# Patient Record
Sex: Female | Born: 1935 | Race: White | Hispanic: No | Smoking: Never smoker
Health system: Southern US, Community
[De-identification: ages and names within clinical notes are randomized; demographics above are authoritative.]

## PROBLEM LIST (undated history)

## (undated) DIAGNOSIS — I639 Cerebral infarction, unspecified: Secondary | ICD-10-CM

## (undated) DIAGNOSIS — K8020 Calculus of gallbladder without cholecystitis without obstruction: Secondary | ICD-10-CM

## (undated) DIAGNOSIS — R569 Unspecified convulsions: Secondary | ICD-10-CM

## (undated) DIAGNOSIS — K635 Polyp of colon: Secondary | ICD-10-CM

## (undated) DIAGNOSIS — M1990 Unspecified osteoarthritis, unspecified site: Secondary | ICD-10-CM

## (undated) DIAGNOSIS — K5790 Diverticulosis of intestine, part unspecified, without perforation or abscess without bleeding: Secondary | ICD-10-CM

## (undated) DIAGNOSIS — E785 Hyperlipidemia, unspecified: Secondary | ICD-10-CM

## (undated) DIAGNOSIS — I10 Essential (primary) hypertension: Secondary | ICD-10-CM

## (undated) HISTORY — DX: Unspecified convulsions: R56.9

## (undated) HISTORY — DX: Calculus of gallbladder without cholecystitis without obstruction: K80.20

## (undated) HISTORY — DX: Diverticulosis of intestine, part unspecified, without perforation or abscess without bleeding: K57.90

## (undated) HISTORY — DX: Essential (primary) hypertension: I10

## (undated) HISTORY — DX: Cerebral infarction, unspecified: I63.9

## (undated) HISTORY — PX: TONSILLECTOMY: SUR1361

## (undated) HISTORY — DX: Polyp of colon: K63.5

## (undated) HISTORY — DX: Unspecified osteoarthritis, unspecified site: M19.90

## (undated) HISTORY — PX: CHOLECYSTECTOMY: SHX55

## (undated) HISTORY — DX: Hyperlipidemia, unspecified: E78.5

---

## 1973-02-03 HISTORY — PX: BREAST LUMPECTOMY: SHX2

## 1998-02-03 DIAGNOSIS — I639 Cerebral infarction, unspecified: Secondary | ICD-10-CM

## 1998-02-03 HISTORY — DX: Cerebral infarction, unspecified: I63.9

## 1998-05-18 ENCOUNTER — Other Ambulatory Visit: Admission: RE | Admit: 1998-05-18 | Discharge: 1998-05-18 | Payer: Self-pay | Admitting: Obstetrics and Gynecology

## 1998-08-02 ENCOUNTER — Emergency Department (HOSPITAL_COMMUNITY): Admission: EM | Admit: 1998-08-02 | Discharge: 1998-08-03 | Payer: Self-pay | Admitting: Emergency Medicine

## 1998-08-02 ENCOUNTER — Emergency Department (HOSPITAL_COMMUNITY): Admission: EM | Admit: 1998-08-02 | Discharge: 1998-08-02 | Payer: Self-pay | Admitting: Emergency Medicine

## 1998-08-02 ENCOUNTER — Encounter: Payer: Self-pay | Admitting: Emergency Medicine

## 1998-08-21 ENCOUNTER — Ambulatory Visit (HOSPITAL_COMMUNITY): Admission: RE | Admit: 1998-08-21 | Discharge: 1998-08-21 | Payer: Self-pay | Admitting: Cardiovascular Disease

## 1998-09-05 ENCOUNTER — Encounter: Admission: RE | Admit: 1998-09-05 | Discharge: 1998-10-18 | Payer: Self-pay

## 1999-02-04 DIAGNOSIS — K635 Polyp of colon: Secondary | ICD-10-CM

## 1999-02-04 HISTORY — DX: Polyp of colon: K63.5

## 1999-12-16 ENCOUNTER — Other Ambulatory Visit: Admission: RE | Admit: 1999-12-16 | Discharge: 1999-12-16 | Payer: Self-pay | Admitting: Gastroenterology

## 1999-12-16 ENCOUNTER — Encounter (INDEPENDENT_AMBULATORY_CARE_PROVIDER_SITE_OTHER): Payer: Self-pay

## 2001-09-13 ENCOUNTER — Encounter: Admission: RE | Admit: 2001-09-13 | Discharge: 2001-09-13 | Payer: Self-pay | Admitting: Family Medicine

## 2001-09-13 ENCOUNTER — Encounter: Payer: Self-pay | Admitting: Family Medicine

## 2003-02-19 ENCOUNTER — Other Ambulatory Visit: Payer: Self-pay

## 2004-02-04 DIAGNOSIS — K5790 Diverticulosis of intestine, part unspecified, without perforation or abscess without bleeding: Secondary | ICD-10-CM

## 2004-02-04 HISTORY — DX: Diverticulosis of intestine, part unspecified, without perforation or abscess without bleeding: K57.90

## 2004-02-27 ENCOUNTER — Ambulatory Visit: Payer: Self-pay | Admitting: Family Medicine

## 2004-03-14 ENCOUNTER — Ambulatory Visit: Payer: Self-pay | Admitting: Family Medicine

## 2004-03-15 ENCOUNTER — Encounter: Admission: RE | Admit: 2004-03-15 | Discharge: 2004-03-15 | Payer: Self-pay | Admitting: Family Medicine

## 2004-03-21 ENCOUNTER — Ambulatory Visit: Payer: Self-pay | Admitting: Gastroenterology

## 2004-03-26 ENCOUNTER — Encounter: Admission: RE | Admit: 2004-03-26 | Discharge: 2004-03-26 | Payer: Self-pay | Admitting: Family Medicine

## 2004-04-02 ENCOUNTER — Ambulatory Visit: Payer: Self-pay | Admitting: Gastroenterology

## 2004-04-10 ENCOUNTER — Encounter: Admission: RE | Admit: 2004-04-10 | Discharge: 2004-04-10 | Payer: Self-pay | Admitting: Family Medicine

## 2004-04-16 ENCOUNTER — Ambulatory Visit: Payer: Self-pay | Admitting: Family Medicine

## 2004-07-18 ENCOUNTER — Ambulatory Visit: Payer: Self-pay | Admitting: Gastroenterology

## 2004-08-02 ENCOUNTER — Ambulatory Visit: Payer: Self-pay | Admitting: Gastroenterology

## 2004-10-22 ENCOUNTER — Encounter: Admission: RE | Admit: 2004-10-22 | Discharge: 2004-10-22 | Payer: Self-pay | Admitting: Family Medicine

## 2005-06-03 ENCOUNTER — Ambulatory Visit: Payer: Self-pay | Admitting: Family Medicine

## 2005-06-26 ENCOUNTER — Encounter: Admission: RE | Admit: 2005-06-26 | Discharge: 2005-06-26 | Payer: Self-pay | Admitting: Family Medicine

## 2005-10-15 ENCOUNTER — Ambulatory Visit: Payer: Self-pay | Admitting: Family Medicine

## 2006-02-18 ENCOUNTER — Encounter: Payer: Self-pay | Admitting: Internal Medicine

## 2006-02-18 ENCOUNTER — Ambulatory Visit: Payer: Self-pay | Admitting: Family Medicine

## 2006-02-18 LAB — CONVERTED CEMR LAB
Albumin: 3.9 g/dL (ref 3.5–5.2)
BUN: 15 mg/dL (ref 6–23)
CO2: 31 meq/L (ref 19–32)
Calcium: 9.3 mg/dL (ref 8.4–10.5)
Creatinine, Ser: 0.9 mg/dL (ref 0.4–1.2)
GFR calc non Af Amer: 66 mL/min

## 2006-04-22 ENCOUNTER — Ambulatory Visit: Payer: Self-pay | Admitting: Family Medicine

## 2006-04-22 LAB — CONVERTED CEMR LAB
AST: 19 units/L (ref 0–37)
Albumin: 3.9 g/dL (ref 3.5–5.2)
Alkaline Phosphatase: 84 units/L (ref 39–117)
BUN: 21 mg/dL (ref 6–23)
Calcium: 9.3 mg/dL (ref 8.4–10.5)
Chloride: 99 meq/L (ref 96–112)
Creatinine, Ser: 0.7 mg/dL (ref 0.4–1.2)
Total Bilirubin: 0.6 mg/dL (ref 0.3–1.2)

## 2006-04-23 ENCOUNTER — Ambulatory Visit: Payer: Self-pay | Admitting: Gastroenterology

## 2006-05-07 ENCOUNTER — Encounter: Payer: Self-pay | Admitting: Family Medicine

## 2006-05-07 ENCOUNTER — Encounter: Admission: RE | Admit: 2006-05-07 | Discharge: 2006-05-07 | Payer: Self-pay | Admitting: Surgery

## 2006-05-07 ENCOUNTER — Ambulatory Visit: Payer: Self-pay | Admitting: Family Medicine

## 2006-05-07 DIAGNOSIS — J40 Bronchitis, not specified as acute or chronic: Secondary | ICD-10-CM | POA: Insufficient documentation

## 2006-05-07 DIAGNOSIS — Z8679 Personal history of other diseases of the circulatory system: Secondary | ICD-10-CM | POA: Insufficient documentation

## 2006-05-07 DIAGNOSIS — R569 Unspecified convulsions: Secondary | ICD-10-CM | POA: Insufficient documentation

## 2006-05-07 DIAGNOSIS — E785 Hyperlipidemia, unspecified: Secondary | ICD-10-CM

## 2006-05-07 DIAGNOSIS — M810 Age-related osteoporosis without current pathological fracture: Secondary | ICD-10-CM | POA: Insufficient documentation

## 2006-06-04 ENCOUNTER — Encounter (INDEPENDENT_AMBULATORY_CARE_PROVIDER_SITE_OTHER): Payer: Self-pay | Admitting: Specialist

## 2006-06-04 ENCOUNTER — Ambulatory Visit (HOSPITAL_COMMUNITY): Admission: RE | Admit: 2006-06-04 | Discharge: 2006-06-04 | Payer: Self-pay | Admitting: Surgery

## 2006-07-01 DIAGNOSIS — I10 Essential (primary) hypertension: Secondary | ICD-10-CM

## 2006-07-09 ENCOUNTER — Other Ambulatory Visit: Admission: RE | Admit: 2006-07-09 | Discharge: 2006-07-09 | Payer: Self-pay | Admitting: Family Medicine

## 2006-07-09 ENCOUNTER — Encounter: Payer: Self-pay | Admitting: Family Medicine

## 2006-07-09 ENCOUNTER — Ambulatory Visit: Payer: Self-pay | Admitting: Family Medicine

## 2006-07-09 DIAGNOSIS — L821 Other seborrheic keratosis: Secondary | ICD-10-CM | POA: Insufficient documentation

## 2006-07-09 LAB — CONVERTED CEMR LAB
Blood in Urine, dipstick: NEGATIVE
Nitrite: NEGATIVE
Protein, U semiquant: NEGATIVE
WBC Urine, dipstick: NEGATIVE

## 2006-07-13 LAB — CONVERTED CEMR LAB
Alkaline Phosphatase: 97 units/L (ref 39–117)
BUN: 20 mg/dL (ref 6–23)
Basophils Relative: 0.7 % (ref 0.0–1.0)
Bilirubin, Direct: 0.1 mg/dL (ref 0.0–0.3)
CO2: 30 meq/L (ref 19–32)
GFR calc Af Amer: 107 mL/min
Glucose, Bld: 116 mg/dL — ABNORMAL HIGH (ref 70–99)
Hemoglobin: 11.8 g/dL — ABNORMAL LOW (ref 12.0–15.0)
Lymphocytes Relative: 26.7 % (ref 12.0–46.0)
Monocytes Absolute: 0.4 10*3/uL (ref 0.2–0.7)
Monocytes Relative: 6.7 % (ref 3.0–11.0)
Neutro Abs: 3.6 10*3/uL (ref 1.4–7.7)
Neutrophils Relative %: 65 % (ref 43.0–77.0)
Potassium: 4.8 meq/L (ref 3.5–5.1)
Sodium: 139 meq/L (ref 135–145)
TSH: 1.5 microintl units/mL (ref 0.35–5.50)
Total Protein: 7.1 g/dL (ref 6.0–8.3)

## 2006-07-14 ENCOUNTER — Encounter: Payer: Self-pay | Admitting: Family Medicine

## 2006-07-15 ENCOUNTER — Encounter (INDEPENDENT_AMBULATORY_CARE_PROVIDER_SITE_OTHER): Payer: Self-pay | Admitting: *Deleted

## 2006-07-30 ENCOUNTER — Encounter: Payer: Self-pay | Admitting: Family Medicine

## 2006-07-30 ENCOUNTER — Encounter: Admission: RE | Admit: 2006-07-30 | Discharge: 2006-07-30 | Payer: Self-pay | Admitting: Family Medicine

## 2006-07-31 ENCOUNTER — Encounter (INDEPENDENT_AMBULATORY_CARE_PROVIDER_SITE_OTHER): Payer: Self-pay | Admitting: *Deleted

## 2006-08-05 ENCOUNTER — Telehealth (INDEPENDENT_AMBULATORY_CARE_PROVIDER_SITE_OTHER): Payer: Self-pay | Admitting: *Deleted

## 2006-08-18 ENCOUNTER — Ambulatory Visit: Payer: Self-pay | Admitting: Family Medicine

## 2006-08-27 ENCOUNTER — Telehealth (INDEPENDENT_AMBULATORY_CARE_PROVIDER_SITE_OTHER): Payer: Self-pay | Admitting: *Deleted

## 2007-01-07 ENCOUNTER — Ambulatory Visit: Payer: Self-pay | Admitting: Family Medicine

## 2007-02-10 ENCOUNTER — Telehealth (INDEPENDENT_AMBULATORY_CARE_PROVIDER_SITE_OTHER): Payer: Self-pay | Admitting: *Deleted

## 2007-03-24 ENCOUNTER — Telehealth (INDEPENDENT_AMBULATORY_CARE_PROVIDER_SITE_OTHER): Payer: Self-pay | Admitting: *Deleted

## 2007-04-23 ENCOUNTER — Telehealth (INDEPENDENT_AMBULATORY_CARE_PROVIDER_SITE_OTHER): Payer: Self-pay | Admitting: *Deleted

## 2007-04-28 ENCOUNTER — Ambulatory Visit: Payer: Self-pay | Admitting: Family Medicine

## 2007-04-30 LAB — CONVERTED CEMR LAB
ALT: 26 units/L (ref 0–35)
AST: 23 units/L (ref 0–37)
Albumin: 4.3 g/dL (ref 3.5–5.2)
Alkaline Phosphatase: 78 units/L (ref 39–117)
Bilirubin, Direct: 0.1 mg/dL (ref 0.0–0.3)
Cholesterol: 186 mg/dL (ref 0–200)
Total Protein: 7.4 g/dL (ref 6.0–8.3)
Triglycerides: 113 mg/dL (ref 0–149)

## 2007-05-21 ENCOUNTER — Encounter (INDEPENDENT_AMBULATORY_CARE_PROVIDER_SITE_OTHER): Payer: Self-pay | Admitting: *Deleted

## 2007-05-21 ENCOUNTER — Ambulatory Visit: Payer: Self-pay | Admitting: Family Medicine

## 2007-05-21 ENCOUNTER — Telehealth (INDEPENDENT_AMBULATORY_CARE_PROVIDER_SITE_OTHER): Payer: Self-pay | Admitting: *Deleted

## 2007-05-21 DIAGNOSIS — K3189 Other diseases of stomach and duodenum: Secondary | ICD-10-CM

## 2007-05-21 DIAGNOSIS — R1013 Epigastric pain: Secondary | ICD-10-CM

## 2007-05-21 DIAGNOSIS — R0789 Other chest pain: Secondary | ICD-10-CM

## 2007-05-21 LAB — CONVERTED CEMR LAB
Basophils Absolute: 0 10*3/uL (ref 0.0–0.1)
CK-MB: 1.5 ng/mL (ref 0.3–4.0)
CO2: 29 meq/L (ref 19–32)
Calcium: 9.5 mg/dL (ref 8.4–10.5)
GFR calc Af Amer: 91 mL/min
Glucose, Bld: 132 mg/dL — ABNORMAL HIGH (ref 70–99)
Lymphocytes Relative: 21.9 % (ref 12.0–46.0)
MCHC: 32.6 g/dL (ref 30.0–36.0)
Monocytes Relative: 4.7 % (ref 3.0–12.0)
Platelets: 394 10*3/uL (ref 150–400)
Potassium: 4.2 meq/L (ref 3.5–5.1)
RDW: 15.1 % — ABNORMAL HIGH (ref 11.5–14.6)
Sodium: 134 meq/L — ABNORMAL LOW (ref 135–145)
TSH: 1.86 microintl units/mL (ref 0.35–5.50)

## 2007-08-02 ENCOUNTER — Encounter: Admission: RE | Admit: 2007-08-02 | Discharge: 2007-08-02 | Payer: Self-pay | Admitting: Family Medicine

## 2007-08-04 ENCOUNTER — Encounter (INDEPENDENT_AMBULATORY_CARE_PROVIDER_SITE_OTHER): Payer: Self-pay | Admitting: *Deleted

## 2007-11-02 ENCOUNTER — Ambulatory Visit: Payer: Self-pay | Admitting: Family Medicine

## 2007-11-04 ENCOUNTER — Encounter (INDEPENDENT_AMBULATORY_CARE_PROVIDER_SITE_OTHER): Payer: Self-pay | Admitting: *Deleted

## 2007-11-04 LAB — CONVERTED CEMR LAB
ALT: 25 units/L (ref 0–35)
AST: 29 units/L (ref 0–37)
Bilirubin, Direct: 0.1 mg/dL (ref 0.0–0.3)
Cholesterol: 193 mg/dL (ref 0–200)
Total Bilirubin: 0.7 mg/dL (ref 0.3–1.2)
Total Protein: 7.3 g/dL (ref 6.0–8.3)
VLDL: 22 mg/dL (ref 0–40)

## 2008-02-11 ENCOUNTER — Telehealth: Payer: Self-pay | Admitting: Family Medicine

## 2008-06-21 ENCOUNTER — Telehealth (INDEPENDENT_AMBULATORY_CARE_PROVIDER_SITE_OTHER): Payer: Self-pay | Admitting: *Deleted

## 2008-09-22 ENCOUNTER — Telehealth (INDEPENDENT_AMBULATORY_CARE_PROVIDER_SITE_OTHER): Payer: Self-pay | Admitting: *Deleted

## 2008-10-19 ENCOUNTER — Ambulatory Visit: Payer: Self-pay | Admitting: Family Medicine

## 2008-10-31 ENCOUNTER — Encounter: Payer: Self-pay | Admitting: Family Medicine

## 2008-10-31 LAB — CONVERTED CEMR LAB
AST: 21 units/L (ref 0–37)
Alkaline Phosphatase: 61 units/L (ref 39–117)
BUN: 15 mg/dL (ref 6–23)
Bilirubin, Direct: 0 mg/dL (ref 0.0–0.3)
CO2: 31 meq/L (ref 19–32)
Calcium: 9.5 mg/dL (ref 8.4–10.5)
Chloride: 101 meq/L (ref 96–112)
Creatinine, Ser: 0.9 mg/dL (ref 0.4–1.2)
Glucose, Bld: 100 mg/dL — ABNORMAL HIGH (ref 70–99)
LDL Cholesterol: 74 mg/dL (ref 0–99)
Total Bilirubin: 0.8 mg/dL (ref 0.3–1.2)
Total CHOL/HDL Ratio: 2
Triglycerides: 103 mg/dL (ref 0.0–149.0)

## 2008-11-08 ENCOUNTER — Encounter: Payer: Self-pay | Admitting: Family Medicine

## 2008-11-09 ENCOUNTER — Encounter: Payer: Self-pay | Admitting: Family Medicine

## 2008-11-09 ENCOUNTER — Encounter: Admission: RE | Admit: 2008-11-09 | Discharge: 2008-11-09 | Payer: Self-pay | Admitting: Family Medicine

## 2008-11-13 ENCOUNTER — Telehealth (INDEPENDENT_AMBULATORY_CARE_PROVIDER_SITE_OTHER): Payer: Self-pay | Admitting: *Deleted

## 2008-11-22 ENCOUNTER — Telehealth (INDEPENDENT_AMBULATORY_CARE_PROVIDER_SITE_OTHER): Payer: Self-pay | Admitting: *Deleted

## 2008-11-23 ENCOUNTER — Ambulatory Visit: Payer: Self-pay | Admitting: Family Medicine

## 2008-11-23 DIAGNOSIS — R300 Dysuria: Secondary | ICD-10-CM

## 2008-11-23 LAB — CONVERTED CEMR LAB
Bilirubin Urine: NEGATIVE
Glucose, Urine, Semiquant: 100
Nitrite: NEGATIVE
Protein, U semiquant: NEGATIVE
Specific Gravity, Urine: 1.015
Urobilinogen, UA: 0.2
WBC Urine, dipstick: NEGATIVE

## 2008-11-27 ENCOUNTER — Encounter: Payer: Self-pay | Admitting: Family Medicine

## 2008-11-28 ENCOUNTER — Telehealth: Payer: Self-pay | Admitting: Family Medicine

## 2008-11-29 ENCOUNTER — Ambulatory Visit: Payer: Self-pay | Admitting: Family Medicine

## 2008-11-29 DIAGNOSIS — R109 Unspecified abdominal pain: Secondary | ICD-10-CM

## 2008-11-29 LAB — CONVERTED CEMR LAB
Blood in Urine, dipstick: NEGATIVE
Glucose, Urine, Semiquant: 250
Protein, U semiquant: NEGATIVE
Urobilinogen, UA: 0.2
WBC Urine, dipstick: NEGATIVE
pH: 6

## 2008-11-30 ENCOUNTER — Encounter: Admission: RE | Admit: 2008-11-30 | Discharge: 2008-11-30 | Payer: Self-pay | Admitting: Family Medicine

## 2008-11-30 ENCOUNTER — Encounter: Payer: Self-pay | Admitting: Family Medicine

## 2009-01-19 IMAGING — RF DG CHOLANGIOGRAM OPERATIVE
1 series · 4 of 4 positions shown · non-contrast
Comparison: none

CLINICAL DATA: Cholelithiasis

[Series 1: run · 4 of 49 frames shown]
[frame 8/49]
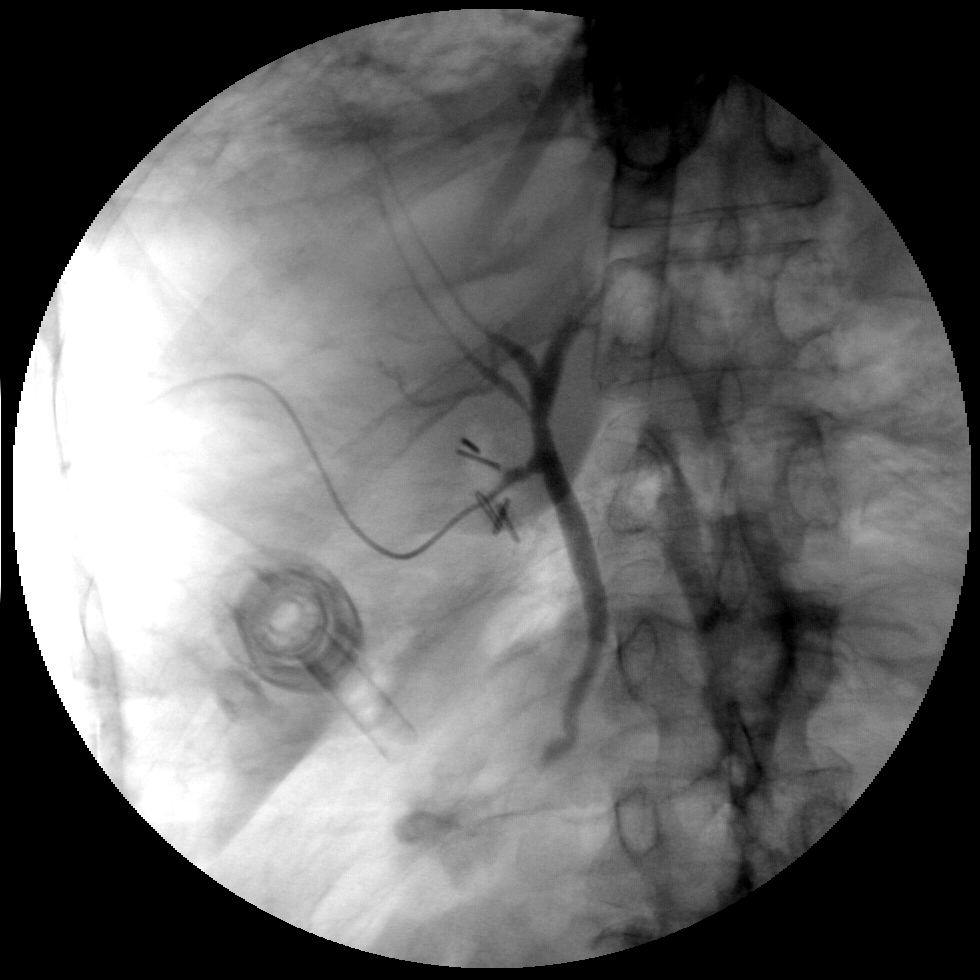
[frame 25/49]
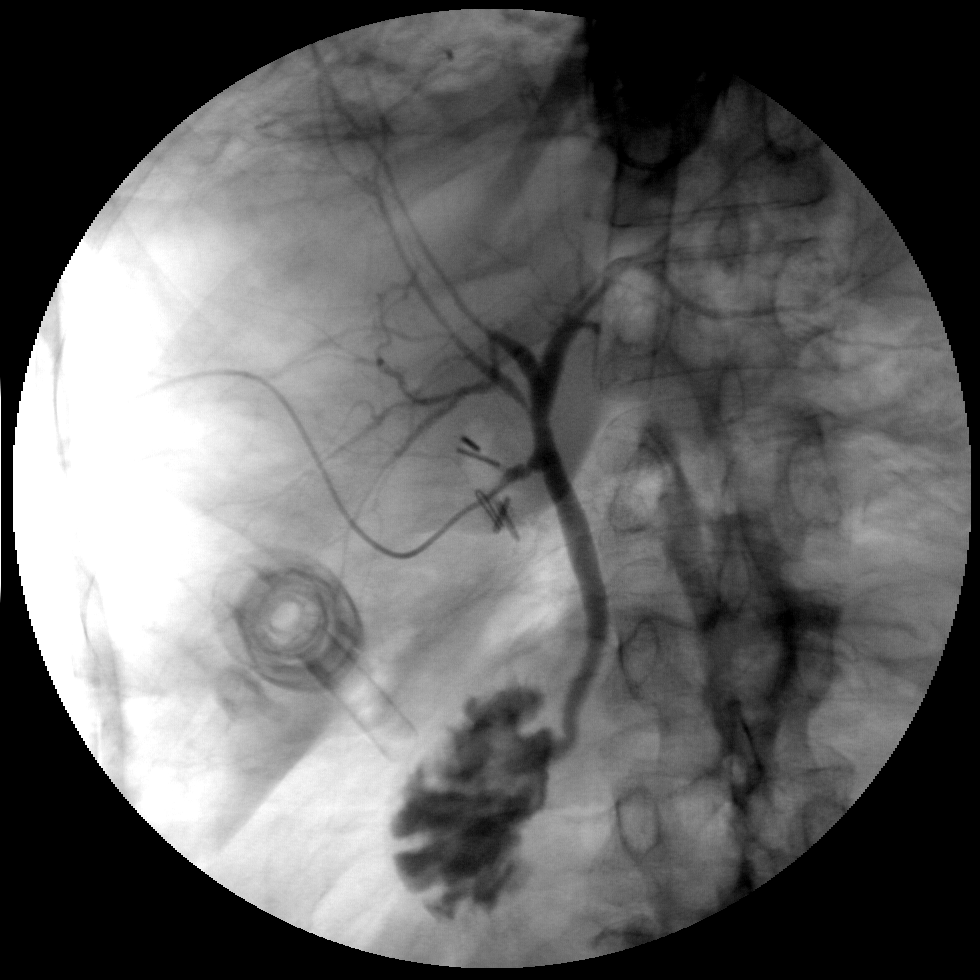
[frame 32/49]
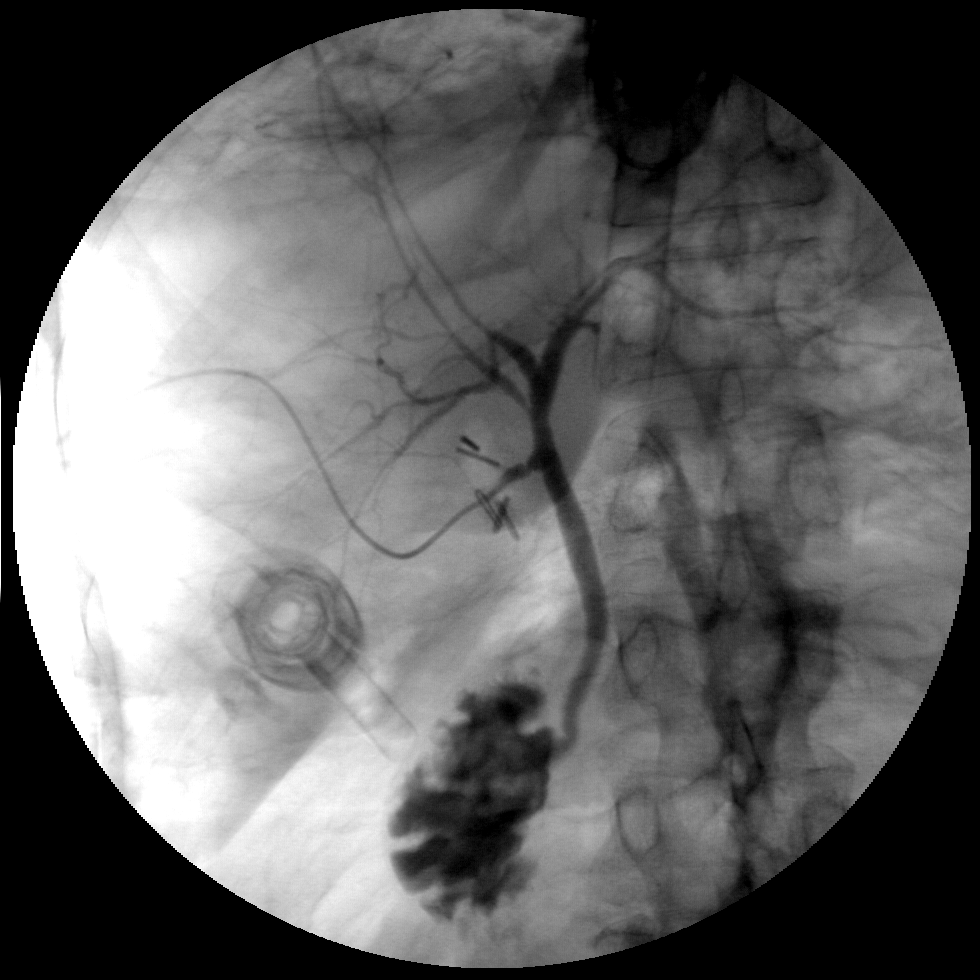
[frame 42/49]
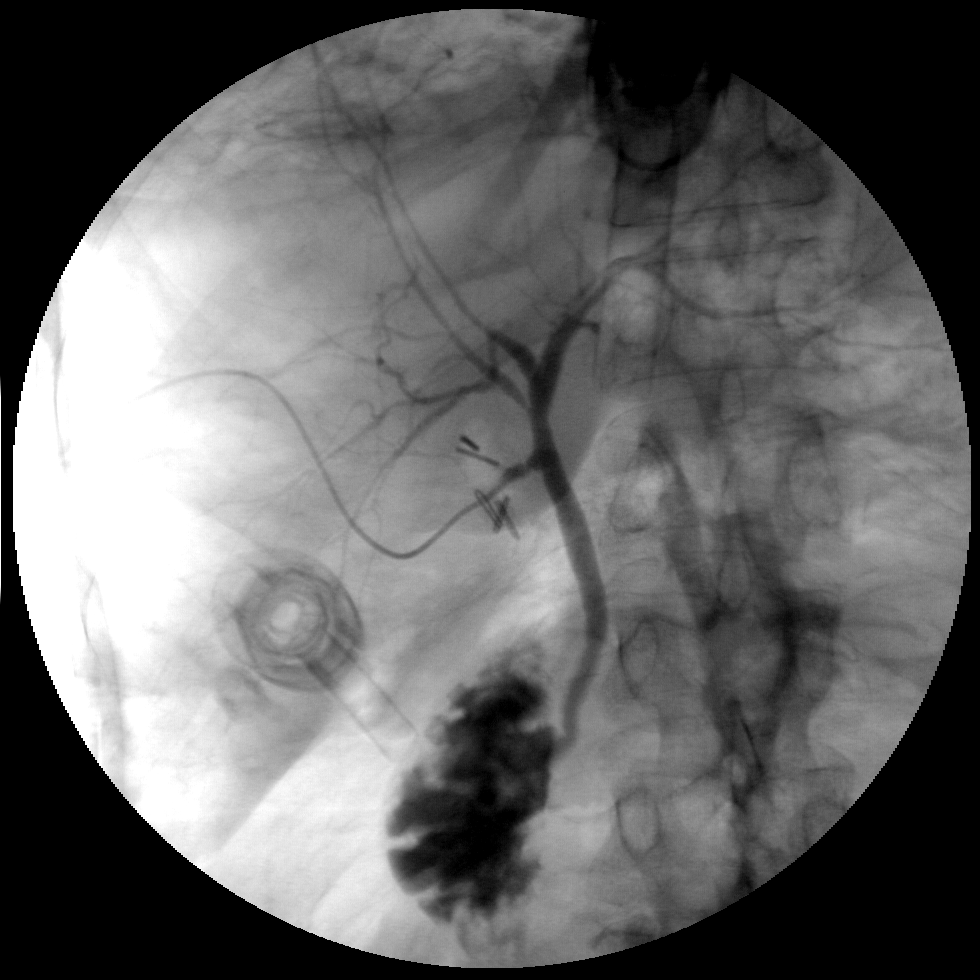

[4 of 4 positions shown; findings below may reference images not displayed]

INTRAOPERATIVE CHOLANGIOGRAM:

49  images from intraoperative C-arm fluoroscopy demonstrate  opacification of
the common bile duct. No filling defects to suggest retained stones. There is
incomplete evaluation of intrahepatic biliary tree, which appears decompressed
centrally. Contrast appears to flow on into decompressed duodenum.
IMPRESSION: 1. Negative for retained common duct stone

## 2009-09-25 ENCOUNTER — Telehealth (INDEPENDENT_AMBULATORY_CARE_PROVIDER_SITE_OTHER): Payer: Self-pay | Admitting: *Deleted

## 2009-10-01 ENCOUNTER — Encounter (INDEPENDENT_AMBULATORY_CARE_PROVIDER_SITE_OTHER): Payer: Self-pay | Admitting: *Deleted

## 2009-10-19 ENCOUNTER — Ambulatory Visit: Payer: Self-pay | Admitting: Family Medicine

## 2009-10-23 LAB — CONVERTED CEMR LAB
ALT: 20 units/L (ref 0–35)
AST: 22 units/L (ref 0–37)
BUN: 18 mg/dL (ref 6–23)
Bilirubin, Direct: 0.2 mg/dL (ref 0.0–0.3)
Calcium: 9.8 mg/dL (ref 8.4–10.5)
GFR calc non Af Amer: 74.85 mL/min (ref >60)
Glucose, Bld: 98 mg/dL (ref 70–99)
HDL: 112.4 mg/dL (ref >39.00)
Sodium: 138 meq/L (ref 135–145)
Total Protein: 6.8 g/dL (ref 6.0–8.3)
Triglycerides: 87 mg/dL (ref 0.0–149.0)
VLDL: 17.4 mg/dL (ref 0.0–40.0)
Vit D, 25-Hydroxy: 36 ng/mL (ref 30–89)

## 2009-10-26 ENCOUNTER — Telehealth (INDEPENDENT_AMBULATORY_CARE_PROVIDER_SITE_OTHER): Payer: Self-pay | Admitting: *Deleted

## 2009-11-02 ENCOUNTER — Ambulatory Visit: Payer: Self-pay | Admitting: Family Medicine

## 2009-11-06 ENCOUNTER — Encounter: Payer: Self-pay | Admitting: Family Medicine

## 2009-11-07 ENCOUNTER — Encounter: Payer: Self-pay | Admitting: Family Medicine

## 2009-12-19 ENCOUNTER — Encounter: Admission: RE | Admit: 2009-12-19 | Discharge: 2009-12-19 | Payer: Self-pay | Admitting: Family Medicine

## 2010-02-24 ENCOUNTER — Encounter: Payer: Self-pay | Admitting: Family Medicine

## 2010-03-05 NOTE — Assessment & Plan Note (Signed)
Summary: prolia injection/cdj   Nurse Visit   Allergies: No Known Drug Allergies  Medication Administration  Injection # 1:    Medication: Prolia 60mg     Diagnosis: OSTEOPOROSIS (ICD-733.00)    Route: SQ    Site: R deltoid    Exp Date: 01/04/2011    Lot #: [id number]    Mfr: amgen    Patient tolerated injection without complications    Given by: Almeta Monas CMA Duncan Dull) (November 02, 2009 2:29 PM)  Orders Added: 1)  Admin of Therapeutic Inj  intramuscular or subcutaneous [96372] 2)  Prolia 60mg  [J3590]

## 2010-03-05 NOTE — Progress Notes (Signed)
Summary: Needs to schedule Prolia Visit 9/23  Phone Note Outgoing Call   Call placed by: Almeta Monas CMA Duncan Dull),  October 26, 2009 9:34 AM Call placed to: Patient Details for Reason: Prolia  Summary of Call: Left message to call back...Marland KitchenMarland KitchenSchedule Nurse visit for Prolia Injection.  Initial call taken by: Almeta Monas CMA Duncan Dull),  October 26, 2009 9:35 AM  Follow-up for Phone Call        patient aware appt scheduled 11/01/09.Marland KitchenDoristine Devoid CMA  October 26, 2009 10:28 AM

## 2010-03-05 NOTE — Progress Notes (Signed)
Summary: refill   Phone Note Outgoing Call   Details for Reason: Pt req refill of Lipitor Summary of Call: pt over due for labs and ov----schedule ov , fasting  --then can refill x1   Treid calling pt, Lmtc on vm for return call.... Almeta Monas CMA Duncan Dull)  September 25, 2009 9:29 AM    Follow-up for Phone Call        pt aware, rx sent in to pharmacy, will call later to schedule appt...........Marland KitchenFelecia Deloach CMA  September 25, 2009 10:19 AM     Prescriptions: LIPITOR 40 MG TABS (ATORVASTATIN CALCIUM) every night at bedtime  #30 x 0   Entered by:   Jeremy Johann CMA   Authorized by:   Loreen Freud DO   Signed by:   Jeremy Johann CMA on 09/25/2009   Method used:   Faxed to ...       [facility] [address] [facility]ail[facility]       [address] [place], [place]  [address]       Ph: [phone]       Fax: [phone]   RxID:   [id number]

## 2010-03-05 NOTE — Assessment & Plan Note (Signed)
Summary: ROA  AND FASTING LABS---NEEDS LIPITOR REFILL, FLU SHOT///SPH   Vital Signs:  Patient profile:   75 year old female Height:      57.75 inches Weight:      106.2 pounds BMI:     22.47 Temp:     97.9 degrees F oral Pulse rate:   76 / minute Pulse rhythm:   regular BP sitting:   122 / 80  (left arm)  Vitals Entered By: Almeta Monas CMA Duncan Dull) (October 19, 2009 10:58 AM) CC: F/U OV with fasting labs   History of Present Illness:  Hyperlipidemia follow-up      This is a 75 year old woman who presents for Hyperlipidemia follow-up.  The patient denies muscle aches, GI upset, abdominal pain, flushing, itching, constipation, diarrhea, and fatigue.  The patient denies the following symptoms: chest pain/pressure, exercise intolerance, dypsnea, palpitations, syncope, and pedal edema.  Compliance with medications (by patient report) has been near 100%.  Dietary compliance has been good.  The patient reports no exercise.  Adjunctive measures currently used by the patient include ASA.    Hypertension follow-up      The patient also presents for Hypertension follow-up.  The patient denies lightheadedness, urinary frequency, headaches, edema, impotence, rash, and fatigue.  The patient denies the following associated symptoms: chest pain, chest pressure, exercise intolerance, dyspnea, palpitations, syncope, leg edema, and pedal edema.  Compliance with medications (by patient report) has been near 100%.  The patient reports that dietary compliance has been good.  The patient reports no exercise.  Adjunctive measures currently used by the patient include salt restriction.    Current Medications (verified): 1)  Carbatrol 300 Mg Cp12 (Carbamazepine) .... Takes One By Mouth Two Times A Day 2)  Plavix 75 Mg Tabs (Clopidogrel Bisulfate) 3)  Aspirin 81 Mg Tbec (Aspirin) 4)  Lipitor 40 Mg Tabs (Atorvastatin Calcium) .... Every Night At Bedtime 5)  Benazepril Hcl 10 Mg Tabs (Benazepril Hcl) .... Two  By Mouth Once Daily 6)  Prolia 60 Mg/ml Soln (Denosumab)  Allergies (verified): No Known Drug Allergies  Past History:  Past medical, surgical, family and social histories (including risk factors) reviewed for relevance to current acute and chronic problems.  Past Medical History: Reviewed history from 07/09/2006 and no changes required. Hyperlipidemia Seizure disorder Cerebrovascular accident, hx of,  2000 Osteoporosis Hypertension  Past Surgical History: Reviewed history from 07/09/2006 and no changes required. Cholecystectomy (06/04/2006),  Gross Lumpectomy ,  Left  1975 Tonsillectomy  Family History: Reviewed history from 07/09/2006 and no changes required. Family History Osteoporosis f- MI at 32 b- quadruple bypass  Social History: Reviewed history from 07/09/2006 and no changes required. Retired, Arts administrator-  Hospital doctor. Divorced Never Smoked Alcohol use-no Drug use-no Regular exercise-yes  Review of Systems      See HPI  Physical Exam  General:  Well-developed,well-nourished,in no acute distress; alert,appropriate and cooperative throughout examination Neck:  No deformities, masses, or tenderness noted. Lungs:  Normal respiratory effort, chest expands symmetrically. Lungs are clear to auscultation, no crackles or wheezes. Heart:  normal rate and no murmur.   Extremities:  No clubbing, cyanosis, edema, or deformity noted with normal full range of motion of all joints.   Skin:  red flakey round spot L cheek  Psych:  Oriented X3 and normally interactive.     Impression & Recommendations:  Problem # 1:  HYPERTENSION (ICD-401.9)  Her updated medication list for this problem includes:    Benazepril Hcl 10 Mg Tabs (Benazepril  hcl) ....Marland Kitchen Two by mouth once daily  Orders: Venipuncture (04540) TLB-Lipid Panel (80061-LIPID) TLB-BMP (Basic Metabolic Panel-BMET) (80048-METABOL) TLB-Hepatic/Liver Function Pnl (80076-HEPATIC)  BP today: 122/80 Prior BP: 122/80  (11/29/2008)  Labs Reviewed: K+: 4.5 (10/19/2008) Creat: : 0.9 (10/19/2008)   Chol: 190 (10/19/2008)   HDL: 95.20 (10/19/2008)   LDL: 74 (10/19/2008)   TG: 103.0 (10/19/2008)  Problem # 2:  OSTEOPOROSIS (ICD-733.00)  Her updated medication list for this problem includes:    Prolia 60 Mg/ml Soln (Denosumab)  Orders: Venipuncture (98119) TLB-Lipid Panel (80061-LIPID) TLB-BMP (Basic Metabolic Panel-BMET) (80048-METABOL) TLB-Hepatic/Liver Function Pnl (80076-HEPATIC) T-Vitamin D (25-Hydroxy) (14782-95621)  Problem # 3:  HYPERLIPIDEMIA (ICD-272.4)  Her updated medication list for this problem includes:    Lipitor 40 Mg Tabs (Atorvastatin calcium) ..... Every night at bedtime  Orders: Venipuncture (30865) TLB-Lipid Panel (80061-LIPID) TLB-BMP (Basic Metabolic Panel-BMET) (80048-METABOL) TLB-Hepatic/Liver Function Pnl (80076-HEPATIC)  Labs Reviewed: SGOT: 21 (10/19/2008)   SGPT: 20 (10/19/2008)   HDL:95.20 (10/19/2008), 94.0 (11/02/2007)  LDL:74 (10/19/2008), 77 (11/02/2007)  Chol:190 (10/19/2008), 193 (11/02/2007)  Trig:103.0 (10/19/2008), 111 (11/02/2007)  Complete Medication List: 1)  Carbatrol 300 Mg Cp12 (Carbamazepine) .... Takes one by mouth two times a day 2)  Plavix 75 Mg Tabs (Clopidogrel bisulfate) 3)  Aspirin 81 Mg Tbec (Aspirin) 4)  Lipitor 40 Mg Tabs (Atorvastatin calcium) .... Every night at bedtime 5)  Benazepril Hcl 10 Mg Tabs (Benazepril hcl) .... Two by mouth once daily 6)  Prolia 60 Mg/ml Soln (Denosumab)  Other Orders: T- * Misc. Laboratory test [phone]) Dermatology Referral [facility])  Patient Instructions: 1)  Please schedule a follow-up appointment in 6 months .                                                                                      Prescriptions: LIPITOR 40 MG TABS (ATORVASTATIN CALCIUM) every night at bedtime  #90 x 3   Entered and Authorized by:   Loreen Freud DO   Signed by:   Loreen Freud DO on 10/19/2009   Method used:    [facility]        [id number][facility] (retail[facility]       [address] [place], [place]  [address]       Ph: [phone]       Fax: [phone]   RxID:   [id number]

## 2010-03-05 NOTE — Progress Notes (Signed)
Summary: PT WOKE UP THIS MORNING WITH PAIN ON RIGHT SIDE OF CHEST  Phone Note Call from Patient Call back at Work Phone [phone]   Caller: Patient Reason for Call: Acute Illness, Talk to Nurse Summary of Call: DR LOWNE// PT GOT UP THIS MORNING AND HAD PAIN IN RIGHT SIDE OF CHEST. PT THINKS IS A MUSCLE PULLED. PT IS NOT SURE. PT WANTS TO COME IN AND GET A EKG DONE JUST TO MAKE SURE IS NOT ANYTHING ELSE. Initial call taken by: Job Founds,  May 21, 2007 8:47 AM  Follow-up for Phone Call        SPOKEW ITHPT OV SCHED THIS AM...................................................................Marland KitchenKandice Hams  May 21, 2007 10:29 AM  Follow-up by: Kandice Hams,  May 21, 2007 10:29 AM

## 2010-03-05 NOTE — Letter (Signed)
Summary: Primary Care Appointment Letter  [facility] [place]  [address] [place], [place] [address]   Phone: [phone]  Fax: [phone]    10/01/2009 MRN: [id number]  [facility] [address] [place], [place]  [address]  Dear Ms. Strohmeier,   Your Primary Care Physician Loreen Freud DO has indicated that:    ___X____it is time to schedule an appointment for fasting labs and office visit.    _______you missed your appointment on______ and need to call and          reschedule.    _______you need to have lab work done.    _______you need to schedule an appointment discuss lab or test results.    _______you need to call to reschedule your appointment that is                       scheduled on _________.     Please call our office as soon as possible. Our phone number is [phone]_____. Our office is open 8a--5p, Monday through Friday.     Thank you,    [employer] Primary Care Scheduler

## 2010-03-05 NOTE — Medication Information (Signed)
Summary: Prior Authorization for Prolia/[employer]  Prior Authorization for Prolia/[employer]   Imported By: Lanelle Bal 11/15/2009 09:38:01  _____________________________________________________________________  External Attachment:    Type:   Image     Comment:   External Document

## 2010-03-05 NOTE — Medication Information (Signed)
Summary: Approval for Prolia/[employer]  Approval for Prolia/[employer]   Imported By: Lanelle Bal 11/14/2009 09:37:13  _____________________________________________________________________  External Attachment:    Type:   Image     Comment:   External Document

## 2010-05-02 ENCOUNTER — Encounter: Payer: Self-pay | Admitting: Family Medicine

## 2010-05-03 ENCOUNTER — Ambulatory Visit: Payer: Self-pay

## 2010-05-03 ENCOUNTER — Ambulatory Visit (INDEPENDENT_AMBULATORY_CARE_PROVIDER_SITE_OTHER): Payer: Medicare Other | Admitting: Family Medicine

## 2010-05-03 ENCOUNTER — Encounter: Payer: Self-pay | Admitting: Family Medicine

## 2010-05-03 VITALS — BP 122/72 | Temp 98.9°F | Wt 113.8 lb

## 2010-05-03 DIAGNOSIS — M810 Age-related osteoporosis without current pathological fracture: Secondary | ICD-10-CM

## 2010-05-03 DIAGNOSIS — R109 Unspecified abdominal pain: Secondary | ICD-10-CM

## 2010-05-03 DIAGNOSIS — B80 Enterobiasis: Secondary | ICD-10-CM | POA: Insufficient documentation

## 2010-05-03 MED ORDER — DENOSUMAB 60 MG/ML ~~LOC~~ SOLN
60.0000 mg | Freq: Once | SUBCUTANEOUS | Status: AC
  Administered 2010-05-03: 60 mg via SUBCUTANEOUS

## 2010-05-03 MED ORDER — CARBAMAZEPINE ER 300 MG PO CP12: 300.0000 mg | ORAL_CAPSULE | Freq: Two times a day (BID) | ORAL | Status: AC

## 2010-05-03 MED ORDER — BENAZEPRIL HCL 10 MG PO TABS: 10.0000 mg | ORAL_TABLET | Freq: Every day | ORAL | Status: DC

## 2010-05-03 MED ORDER — MEBENDAZOLE 100 MG PO CHEW: CHEWABLE_TABLET | ORAL | Status: DC

## 2010-05-03 MED ORDER — CLOPIDOGREL BISULFATE 75 MG PO TABS: 75.0000 mg | ORAL_TABLET | Freq: Every day | ORAL | Status: AC

## 2010-05-03 MED ORDER — ATORVASTATIN CALCIUM 40 MG PO TABS: 40.0000 mg | ORAL_TABLET | Freq: Every day | ORAL | Status: DC

## 2010-05-03 NOTE — Progress Notes (Signed)
  Subjective:    Patient ID: [name], female    DOB: 1935-08-12, 75 y.o.   MRN: [id number]  Abdominal Pain  Pt here c/o 2-3 month hx of "bugs coming out of rectum".   No rectal itching, no diarrhea or loose stools. Pt with hx diverticulitis but has had none of those symptoms.  She says this is different.   Pt is afraid she has a worm or parasite -- she sees them in her bed.  She has some discomfort.     Review of Systems  Gastrointestinal: Positive for abdominal pain.  All other systems reviewed and are negative.       Objective:   Physical Exam  Constitutional: She appears well-developed and well-nourished.  Pulmonary/Chest: Breath sounds normal.  Abdominal: Soft. Normal appearance and bowel sounds are normal. There is no tenderness. There is no rebound.  Genitourinary:     Musculoskeletal: Normal range of motion.          Assessment & Plan:

## 2010-05-03 NOTE — Assessment & Plan Note (Signed)
?   Worms--- but doubt

## 2010-05-08 NOTE — Progress Notes (Signed)
Letter mailed    Kp 

## 2010-05-09 ENCOUNTER — Other Ambulatory Visit (INDEPENDENT_AMBULATORY_CARE_PROVIDER_SITE_OTHER): Payer: Medicare Other

## 2010-05-09 DIAGNOSIS — E785 Hyperlipidemia, unspecified: Secondary | ICD-10-CM

## 2010-05-09 LAB — HEPATIC FUNCTION PANEL
ALT: 20 U/L (ref 0–35)
AST: 21 U/L (ref 0–37)
Albumin: 4 g/dL (ref 3.5–5.2)
Total Bilirubin: 0.4 mg/dL (ref 0.3–1.2)
Total Protein: 6.5 g/dL (ref 6.0–8.3)

## 2010-05-09 LAB — LIPID PANEL
Cholesterol: 195 mg/dL (ref 0–200)
LDL Cholesterol: 67 mg/dL (ref 0–99)

## 2010-05-13 NOTE — Progress Notes (Signed)
Letter mailed     KP 

## 2010-05-31 ENCOUNTER — Encounter: Payer: Self-pay | Admitting: Family Medicine

## 2010-06-21 NOTE — Op Note (Signed)
[name], [name]               ACCOUNT NO.:  [id number]   MEDICAL RECORD NO.:  [id number]          PATIENT TYPE:  AMB   LOCATION:  DAY                          FACILITY:  [facility]   PHYSICIAN:  Ardeth Sportsman, MD     DATE OF BIRTH:  27-Jul-1935   DATE OF PROCEDURE:  06/04/2006  DATE OF DISCHARGE:                               OPERATIVE REPORT   PRIMARY CARE PHYSICIAN:  Lelon Perla, D.O.   SURGEON:  Ardeth Sportsman, M.D.   ASSISTANT:  Maisie Fus A. Cornett, M.D.   PREOPERATIVE DIAGNOSIS:  Symptomatic cholecystolithiasis.   POSTOPERATIVE DIAGNOSIS:  Symptomatic cholecystolithiasis.   PROCEDURE:  Laparoscopic cholecystectomy with intraoperative  cholangiogram.   SPECIMENS:  Gallbladder.   DRAINS:  None.   ESTIMATED BLOOD LOSS:  Less than 5 mL.   ANESTHESIA:  1. General anesthesia.  2. 0.25% bupivacaine without epinephrine and a field block around all      port sites.   INDICATIONS FOR PROCEDURE:  Ms. Orsborn is a 75 year old female with a  story of biliary colic and old gallstones.  She has had a pretty  extensive cardiopulmonary and GI workup, ruling out other possible  etiologies.   The pathophysiology of cholecystolithiasis with its risks of gallstone  pancreatitis, dehydration, severe pain and dyskinesia, cholecystitis,  and other risks were discussed.  Options were discussed and  recommendations made for a laparoscopic cholecystectomy with  intraoperative cholangiogram.  The risks such as stroke, heart attack,  deep venous thrombosis, pulmonary embolism, and death were discussed.  The risks such as bleeding, need for transfusion, wound infection,  abscess, injury to other organs, injury to bile ducts requiring  operative internal/external drainage or open reconstruction and other  risks were discussed.  Her questions were answered, and she agreed to  proceed.   FINDINGS:  She had a boggy & dilated gallbladder with a very thin cystic  duct.  She had about 2-mm  gallstone fragments x3.  The patient wanted to  keep the stones for herself, and per her request I gave the stones to  her daughter.   DESCRIPTION OF PROCEDURE:  Informed consent was confirmed.  The patient  voided just prior to going to the operating room.  She had sequential  compression devices active during the entire case.  She underwent  general anesthesia without difficulty.  She was placed supine with both  arms tucked.  Her abdomen was prepped and draped in sterile fashion.   Entry was gained into the abdomen with the patient in steep reverse  Trendelenburg with the right side up using a 5-mm/0-degree scope and  optical entry.  Capnoperitoneum to 15 mmHg provided good abdominal  insufflation.   Under direct visualization, a 5-mm port was placed through the umbilicus  in a right lateral subcostal region.  A 10-mm port was placed  subxiphoid.  There was no intraoperative injury.   The gallbladder fundus was grasped and elevated cephalad.  Peritoneal  covers between the gallbladder and the liver were freed off on the  anteromedial and posterolateral aspects.  Circumferential dissection was  done so that  the proximal third of the gallbladder was freed off the  liver bed.  The circumferential dissection revealed two structures  leaving the gallbladder going down to the porta hepatis and were  inconsistent with the cystic duct and cystic artery, given the classic  critical view.  One clip on the gallbladder side and two clips slightly  proximally were made, and the cystic artery was transected.  One clip  was made on the infundibulum, and a partial cysticotomy was performed.   A 5-French cholangiogram catheter was placed through a right subcostal  stab incision, flushed, and placed into the cystic duct without any  difficulty.  Cholangiogram was done with diluted radio-opaque contrast  revealing a side branch cannulization consistent with cystic duct  cannulization.  Contrast  flowed well into the right and left  intrahepatic chains, the common hepatic duct down to the common bile  duct and into the duodenum.  There was no evidence of any strictures,  stones, or other abnormalities.  This was consistent with a normal  cholangiogram.  The cholangiogram catheter was removed.  Four clips  remained on the cystic duct just proximal to the partial cysticotomy,  and cystic duct transection was completed.   The gallbladder was freed from its remaining attachments on the liver  and removed out the subxiphoid port, requiring no extra dilation.  The  fascial defect was greater than allowing my pinky to pass, so therefore  I did close the subfascial port fascia using a 0 Vicryl using the  laparoscopic suture passer.   Careful inspection was done, and there was excellent hemostasis on the  liver bed, with no leak of blood or bile from the liver bed or from the  clips of the stumps.  The upper three abdominal ports were removed.  There was no evidence of any bleeding on the skin or peritoneal site.  The capnoperitoneum was evacuated.  The umbilical port was removed.  The  fascial stitch was tied down in the subxiphoid region.  The skin was  closed using a 4-0 Monocryl stitch and sterile dressings.   The patient was extubated and sent to the recovery room in stable  condition.  I explained the operative findings to the patient's  daughter.  Postoperative recovery was discussed as well.  Gallstones  were given to her in a sterile container per the patient's request.      Ardeth Sportsman, MD  Electronically Signed     SCG/MEDQ  D:  06/04/2006  T:  06/04/2006  Job:  [id number]   cc:   Lelon Perla, DO  [address] [place], [place] [address]

## 2010-06-24 ENCOUNTER — Other Ambulatory Visit: Payer: Self-pay | Admitting: *Deleted

## 2010-06-24 MED ORDER — BENAZEPRIL HCL 10 MG PO TABS: 10.0000 mg | ORAL_TABLET | Freq: Every day | ORAL | Status: DC

## 2010-08-29 ENCOUNTER — Ambulatory Visit (INDEPENDENT_AMBULATORY_CARE_PROVIDER_SITE_OTHER): Payer: Medicare Other | Admitting: Internal Medicine

## 2010-08-29 ENCOUNTER — Encounter: Payer: Self-pay | Admitting: Internal Medicine

## 2010-08-29 VITALS — BP 124/82 | HR 90 | Wt 111.6 lb

## 2010-08-29 DIAGNOSIS — R350 Frequency of micturition: Secondary | ICD-10-CM

## 2010-08-29 DIAGNOSIS — N329 Bladder disorder, unspecified: Secondary | ICD-10-CM | POA: Insufficient documentation

## 2010-08-29 LAB — POCT URINALYSIS DIPSTICK
Bilirubin, UA: NEGATIVE
Glucose, UA: 100
Spec Grav, UA: 1.02
Urobilinogen, UA: 0.2

## 2010-08-29 NOTE — Progress Notes (Signed)
  Subjective:    Patient ID: [name], female    DOB: [birth year], 75 y.o.   MRN: [id number]  HPI  One-week history of urinary frequency without actual dysuria ,urine sometimes looks dark but otherwise normal.  Past Medical History  Diagnosis Date  . Hyperlipidemia   . Seizures   . Cerebrovascular accident 2000  . Osteoporosis   . Hypertension    Past Surgical History  Procedure Date  . Cholecystectomy   . Breast lumpectomy 1975    Left  . Tonsillectomy      Review of Systems No fever or chills No nausea, vomiting, diarrhea. She does have chronic constipation. Mild ill-defined discomfort at the lower abdomen, R>L    Objective:   Physical Exam  Constitutional: She is oriented to person, place, and time. She appears well-developed and well-nourished. No distress.  Abdominal:       No CVA tenderness. Abdomen is nondistended, soft, good bowel sounds, very mild discomfort actually more noticeable on the left lower abdomen. No mass or rebound.  Neurological: She is alert and oriented to person, place, and time.  Skin: She is not diaphoretic.          Assessment & Plan:

## 2010-08-29 NOTE — Assessment & Plan Note (Signed)
1 week h/o urinary frecunecy, mild lower abdominal dyscomfort ROS otherwise negative Plan: UCX Push fluids Call if sx increase, fever , abdominal pain, decreased appetite  Pt in agreement

## 2010-09-03 ENCOUNTER — Telehealth: Payer: Self-pay | Admitting: *Deleted

## 2010-09-03 MED ORDER — NITROFURANTOIN MONOHYD MACRO 100 MG PO CAPS: 100.0000 mg | ORAL_CAPSULE | Freq: Two times a day (BID) | ORAL | Status: AC

## 2010-09-03 NOTE — Telephone Encounter (Signed)
Message left for patient to return my call.  

## 2010-09-03 NOTE — Telephone Encounter (Signed)
Spoke w/ pt aware of information and medication called to pharmacy

## 2010-09-03 NOTE — Telephone Encounter (Signed)
Message copied by Leanne Lovely on Tue Sep 03, 2010  9:17 AM ------      Message from: Willow Ora E      Created: Mon Sep 02, 2010 10:12 PM       Macrodantin 100 mg 1 po bid x 1 week, drink lots of fluids , call if not better in few days

## 2010-09-09 ENCOUNTER — Ambulatory Visit: Payer: Medicare Other | Admitting: Family Medicine

## 2010-09-10 ENCOUNTER — Other Ambulatory Visit (HOSPITAL_COMMUNITY): Payer: Self-pay | Admitting: Orthopedic Surgery

## 2010-09-10 ENCOUNTER — Other Ambulatory Visit: Payer: Self-pay | Admitting: Orthopedic Surgery

## 2010-09-10 ENCOUNTER — Encounter (HOSPITAL_COMMUNITY): Payer: Medicare Other

## 2010-09-10 ENCOUNTER — Ambulatory Visit (HOSPITAL_COMMUNITY)
Admission: RE | Admit: 2010-09-10 | Discharge: 2010-09-10 | Disposition: A | Discharge status: Home / Self Care | Payer: Medicare Other | Source: Ambulatory Visit | Attending: Orthopedic Surgery | Admitting: Orthopedic Surgery

## 2010-09-10 DIAGNOSIS — Z01810 Encounter for preprocedural cardiovascular examination: Secondary | ICD-10-CM | POA: Insufficient documentation

## 2010-09-10 DIAGNOSIS — Z01818 Encounter for other preprocedural examination: Secondary | ICD-10-CM

## 2010-09-10 DIAGNOSIS — Z01812 Encounter for preprocedural laboratory examination: Secondary | ICD-10-CM | POA: Insufficient documentation

## 2010-09-10 DIAGNOSIS — M1710 Unilateral primary osteoarthritis, unspecified knee: Secondary | ICD-10-CM | POA: Insufficient documentation

## 2010-09-10 DIAGNOSIS — R9431 Abnormal electrocardiogram [ECG] [EKG]: Secondary | ICD-10-CM | POA: Insufficient documentation

## 2010-09-10 LAB — SURGICAL PCR SCREEN
MRSA, PCR: NEGATIVE
Staphylococcus aureus: NEGATIVE

## 2010-09-10 LAB — BASIC METABOLIC PANEL
BUN: 21 mg/dL (ref 6–23)
CO2: 29 mEq/L (ref 19–32)
Calcium: 10.6 mg/dL — ABNORMAL HIGH (ref 8.4–10.5)
Glucose, Bld: 106 mg/dL — ABNORMAL HIGH (ref 70–99)
Sodium: 138 mEq/L (ref 135–145)

## 2010-09-10 LAB — URINALYSIS, ROUTINE W REFLEX MICROSCOPIC
Bilirubin Urine: NEGATIVE
Glucose, UA: 250 mg/dL — AB
Hgb urine dipstick: NEGATIVE
Protein, ur: NEGATIVE mg/dL
Specific Gravity, Urine: 1.023 (ref 1.005–1.030)

## 2010-09-10 LAB — DIFFERENTIAL
Basophils Relative: 0 % (ref 0–1)
Eosinophils Absolute: 0 10*3/uL (ref 0.0–0.7)
Eosinophils Relative: 1 % (ref 0–5)
Lymphs Abs: 1.6 10*3/uL (ref 0.7–4.0)
Monocytes Absolute: 0.5 10*3/uL (ref 0.1–1.0)
Monocytes Relative: 8 % (ref 3–12)
Neutrophils Relative %: 68 % (ref 43–77)

## 2010-09-10 LAB — CBC
MCH: 32.9 pg (ref 26.0–34.0)
MCHC: 32.6 g/dL (ref 30.0–36.0)
MCV: 101 fL — ABNORMAL HIGH (ref 78.0–100.0)
Platelets: 272 10*3/uL (ref 150–400)
RBC: 3.98 MIL/uL (ref 3.87–5.11)

## 2010-09-12 ENCOUNTER — Ambulatory Visit (INDEPENDENT_AMBULATORY_CARE_PROVIDER_SITE_OTHER): Payer: Medicare Other | Admitting: Family Medicine

## 2010-09-12 ENCOUNTER — Encounter: Payer: Self-pay | Admitting: Family Medicine

## 2010-09-12 DIAGNOSIS — Z01818 Encounter for other preprocedural examination: Secondary | ICD-10-CM

## 2010-09-12 NOTE — Progress Notes (Signed)
   Subjective:    [name] is a 75 y.o. female who presents to the office today for a preoperative consultation at the request of surgeon Dr Charlann Boxer who plans on performing L total knee replacement and lateral w/wo patella resurfacing  on August 14. This consultation is requested for the specific conditions prompting preoperative evaluation (i.e. because of potential affect on operative risk): htn, hyperlipidemia and advanced age.  Planned anesthesia: spinal. The patient has the following known anesthesia issues: none. Patients bleeding risk: no recent abnormal bleeding. Patient does have objections to receiving blood products if needed.  The following portions of the patient's history were reviewed and updated as appropriate: allergies, current medications, past family history, past medical history, past social history, past surgical history and problem list.  Review of Systems Pertinent items are noted in HPI.    Objective:    BP 120/80  Pulse 78  Wt 111 lb (50.349 kg) General appearance: alert, cooperative, appears stated age and no distress Head: Normocephalic, without obvious abnormality, atraumatic Eyes: conjunctivae/corneas clear. PERRL, EOM's intact. Fundi benign. Ears: normal TM's and external ear canals both ears Nose: Nares normal. Septum midline. Mucosa normal. No drainage or sinus tenderness. Throat: lips, mucosa, and tongue normal; teeth and gums normal Neck: no adenopathy, no carotid bruit, no JVD, supple, symmetrical, trachea midline and thyroid not enlarged, symmetric, no tenderness/mass/nodules Back: negative Lungs: clear to auscultation bilaterally Heart: S1, S2 normal and + murmur Abdomen: soft, non-tender; bowel sounds normal; no masses,  no organomegaly Extremities: extremities normal, atraumatic, no cyanosis or edema Skin: Skin color, texture, turgor normal. No rashes or lesions Lymph nodes: Cervical, supraclavicular, and axillary nodes normal. Neurologic: Alert  and oriented X 3, normal strength and tone. Normal symmetric reflexes. Normal coordination and gait  Predictors of intubation difficulty:  Morbid obesity? no  Anatomically abnormal facies? no  Prominent incisors? no  Receding mandible? no  Short, thick neck? no  Neck range of motion     Cardiographics ECG: done with pre op workup Echocardiogram: not done  Imaging Chest x-ray: normal   Lab Review  not available    Assessment:      75 y.o. female with planned surgery as above.   Known risk factors for perioperative complications: Htn, hyperlipidemia   Difficulty with intubation is not anticipated.        Plan:    1. Preoperative workup as follows : already done -- results not available.. 2. Change in medication regimen before surgery: none, continue medication regimen including morning of surgery, with sip of water. 3. Prophylaxis for cardiac events with perioperative beta-blockers: should be considered, specific regimen per anesthesia. 4. Invasive hemodynamic monitoring perioperatively: at the discretion of anesthesiologist. 5. Deep vein thrombosis prophylaxis postoperatively:regimen to be chosen by surgical team. 6. Surveillance for postoperative MI with ECG immediately postoperatively and on postoperative days 1 and 2 AND troponin levels 24 hours postoperatively and on day 4 or hospital discharge (whichever comes first): at the discretion of anesthesiologist. 7. Other measures: none

## 2010-09-16 NOTE — H&P (Signed)
[name], [name]               ACCOUNT NO.:  [id number]  MEDICAL RECORD NO.:  [id number]  LOCATION:                                 FACILITY:  PHYSICIAN:  Madlyn Frankel. Charlann Boxer, M.D.  DATE OF BIRTH:  01-22-1936  DATE OF ADMISSION: DATE OF DISCHARGE:                             HISTORY & PHYSICAL   DATE OF SURGERY:  September 17, 2010.  DIAGNOSIS:  Left knee osteoarthritis.  HISTORY OF PRESENT ILLNESS:  The patient is a 75 year old white female who is complaining of left knee pain for the past 6+ years.  When she was seen for the knee pain about 6 years ago, she was told that she should consider a total knee replacement.  The patient has been living with pain, getting steroid injections, which at first helped significantly.  The duration of therapeutic effect has decreased over the years.  Her last injection lasted minimal time in past.  X-rays in the clinic do show arthritis of the left knee.  Various options were discussed with the patient.  The patient wished to proceed with surgery. Risks, benefits, and expectations of the procedure were discussed with the patient, the patient understands risks, benefits, and expectations; and wishes to proceed with left total knee replacement.  PRIMARY CARE PHYSICIAN:  Lelon Perla, DO  NEUROLOGIST:  Sandi Mealy, MD.  PAST MEDICAL HISTORY:  The patient has seizure in about 2002.  The patient has strep in 2000.  The patient has evidence for asthma, anxiety, high blood pressure, hyperlipidemia, GERD, IBS, diverticulosis, osteoporosis.  PAST SURGICAL HISTORY:  Cholecystectomy and T and A.  MEDICATIONS: 1. Plavix unknown doses (was stopped 5 days before surgery). 2. Lotensin 10 mg one p.o. daily. 3. Lipitor unknown doses. 4. Carbatrol unknown dose (this medicine is not to be substituted, her     primary care doctor). 5. Aspirin 81 mg one p.o. daily. 6. (The patient is to stay on this). 7. Vitamins. 8. Calcium.  SOCIAL HISTORY:   The patient denies use of alcohol or tobacco.  The patient is complaining of some mild constipation, heartburn, frequent urination especially at night with joint pain and swelling.  All other review of systems are benign.  PHYSICAL EXAMINATION:  GENERAL:  The patient is a 75 year old white female, in no acute distress. VITAL SIGNS:  Stable.  Blood pressure in left arm is 130/70, respiratory is 14, pulse is 80. HEENT:  Pupils are round, reactive to light and accommodation.  Throat is clear. NECK:  Supple.  No JVD.  No carotid bruits.  No lymphadenopathy. CARDIAC:  S1 and S2.  No murmur appreciated. RESPIRATORY:  Lungs clear to auscultation bilaterally. NEURO:  The patient oriented x3. ORTHO:  Pertaining to the left knee, the patient does have some tenderness to palpation associated with the medial anterior tibial surface.  The patient has good flexion.  The patient is lacking 5 degrees of full extension.  The patient has +2 dorsalis pedis pulse.  He is distally neurovascularly intact.  Surgical site looks clear.  IMPRESSION:  Left knee osteoarthritis.  PLAN:  The patient was admitted to the hospital to undergo left total knee replacement per Dr. Charlann Boxer.  Risks,  benefits, and expectations of the procedure were discussed with the patient.  The patient understands these risks, benefits, and expectations and wishes to proceed.    ______________________________ Lanney Gins, PA   ______________________________ Madlyn Frankel. Charlann Boxer, M.D.    MB/MEDQ  D:  09/11/2010  T:  09/12/2010  Job:  [id number]  Electronically Signed by Lanney Gins PA on 09/12/2010 11:54:55 AM Electronically Signed by Durene Romans M.D. on 09/16/2010 07:37:28 AM

## 2010-09-17 ENCOUNTER — Inpatient Hospital Stay (HOSPITAL_COMMUNITY)
Admission: RE | Admit: 2010-09-17 | Discharge: 2010-09-20 | DRG: 470 | Disposition: A | Discharge status: Skilled Nursing Facility | Payer: Medicare Other | Source: Ambulatory Visit | Attending: Orthopedic Surgery | Admitting: Orthopedic Surgery

## 2010-09-17 DIAGNOSIS — R350 Frequency of micturition: Secondary | ICD-10-CM | POA: Diagnosis present

## 2010-09-17 DIAGNOSIS — Z01810 Encounter for preprocedural cardiovascular examination: Secondary | ICD-10-CM

## 2010-09-17 DIAGNOSIS — K589 Irritable bowel syndrome without diarrhea: Secondary | ICD-10-CM | POA: Diagnosis present

## 2010-09-17 DIAGNOSIS — K5730 Diverticulosis of large intestine without perforation or abscess without bleeding: Secondary | ICD-10-CM | POA: Diagnosis present

## 2010-09-17 DIAGNOSIS — Z79899 Other long term (current) drug therapy: Secondary | ICD-10-CM

## 2010-09-17 DIAGNOSIS — K5900 Constipation, unspecified: Secondary | ICD-10-CM | POA: Diagnosis present

## 2010-09-17 DIAGNOSIS — R6889 Other general symptoms and signs: Secondary | ICD-10-CM | POA: Diagnosis present

## 2010-09-17 DIAGNOSIS — I959 Hypotension, unspecified: Secondary | ICD-10-CM | POA: Diagnosis not present

## 2010-09-17 DIAGNOSIS — E785 Hyperlipidemia, unspecified: Secondary | ICD-10-CM | POA: Diagnosis present

## 2010-09-17 DIAGNOSIS — Z01812 Encounter for preprocedural laboratory examination: Secondary | ICD-10-CM

## 2010-09-17 DIAGNOSIS — M1710 Unilateral primary osteoarthritis, unspecified knee: Principal | ICD-10-CM | POA: Diagnosis present

## 2010-09-17 DIAGNOSIS — Z8249 Family history of ischemic heart disease and other diseases of the circulatory system: Secondary | ICD-10-CM

## 2010-09-17 DIAGNOSIS — R9431 Abnormal electrocardiogram [ECG] [EKG]: Secondary | ICD-10-CM | POA: Diagnosis not present

## 2010-09-17 DIAGNOSIS — F411 Generalized anxiety disorder: Secondary | ICD-10-CM | POA: Diagnosis present

## 2010-09-17 DIAGNOSIS — M810 Age-related osteoporosis without current pathological fracture: Secondary | ICD-10-CM | POA: Diagnosis present

## 2010-09-17 DIAGNOSIS — K219 Gastro-esophageal reflux disease without esophagitis: Secondary | ICD-10-CM | POA: Diagnosis present

## 2010-09-17 DIAGNOSIS — I69998 Other sequelae following unspecified cerebrovascular disease: Secondary | ICD-10-CM

## 2010-09-17 DIAGNOSIS — G40909 Epilepsy, unspecified, not intractable, without status epilepticus: Secondary | ICD-10-CM | POA: Diagnosis present

## 2010-09-17 DIAGNOSIS — R55 Syncope and collapse: Secondary | ICD-10-CM | POA: Diagnosis not present

## 2010-09-17 DIAGNOSIS — Z7902 Long term (current) use of antithrombotics/antiplatelets: Secondary | ICD-10-CM

## 2010-09-17 DIAGNOSIS — D62 Acute posthemorrhagic anemia: Secondary | ICD-10-CM | POA: Diagnosis not present

## 2010-09-17 DIAGNOSIS — I10 Essential (primary) hypertension: Secondary | ICD-10-CM | POA: Diagnosis present

## 2010-09-17 DIAGNOSIS — Z7982 Long term (current) use of aspirin: Secondary | ICD-10-CM

## 2010-09-17 DIAGNOSIS — J45909 Unspecified asthma, uncomplicated: Secondary | ICD-10-CM | POA: Diagnosis present

## 2010-09-17 LAB — CARDIAC PANEL(CRET KIN+CKTOT+MB+TROPI)
CK, MB: 3.3 ng/mL (ref 0.3–4.0)
Troponin I: <0.3 ng/mL (ref <0.30)

## 2010-09-17 LAB — TYPE AND SCREEN: Antibody Screen: NEGATIVE

## 2010-09-18 DIAGNOSIS — I369 Nonrheumatic tricuspid valve disorder, unspecified: Secondary | ICD-10-CM

## 2010-09-18 LAB — CBC
Platelets: 187 10*3/uL (ref 150–400)
RBC: 2.49 MIL/uL — ABNORMAL LOW (ref 3.87–5.11)
WBC: 6.6 10*3/uL (ref 4.0–10.5)

## 2010-09-18 LAB — BASIC METABOLIC PANEL
CO2: 26 mEq/L (ref 19–32)
Chloride: 107 mEq/L (ref 96–112)
Potassium: 4.2 mEq/L (ref 3.5–5.1)
Sodium: 138 mEq/L (ref 135–145)

## 2010-09-18 LAB — CARDIAC PANEL(CRET KIN+CKTOT+MB+TROPI)
CK, MB: 2.9 ng/mL (ref 0.3–4.0)
Relative Index: 2.7 — ABNORMAL HIGH (ref 0.0–2.5)
Total CK: 108 U/L (ref 7–177)
Total CK: 124 U/L (ref 7–177)

## 2010-09-18 NOTE — Op Note (Signed)
[name], [name] NO.:  [id number]  MEDICAL RECORD NO.:  [id number]  LOCATION:  1412                         FACILITY:  [facility]  PHYSICIAN:  [name], M.D.  DATE OF BIRTH:  [birth year]  DATE OF PROCEDURE:  09/17/2010 DATE OF DISCHARGE:                              OPERATIVE REPORT   PREOPERATIVE DIAGNOSIS:  Left knee osteoarthritis.  POSTOPERATIVE DIAGNOSIS:  Left knee osteoarthritis.  PROCEDURE:  Left total knee replacement.  COMPONENTS USED:  DePuy rotating platform posterior stabilized knee system with a size 2 femur, 1.5 tibia, 12.5 insert, and a 35 patellar button.  SURGEON:  [name], M.D.  ASSISTANT:  Jaquelyn Bitter. Chabon, P.A.  ANESTHESIA:  Spinal.  SPECIMENS:  None.  COMPLICATIONS:  None.  TOURNIQUET TIME:  28 minutes, 250 mmHg.  INDICATIONS FOR PROCEDURE:  [name] is a 75 year old female with left knee osteoarthritis.  She had failed conservative measures of injections, medications, and attempts at activity modification.  She, at this point, was ready to proceed more definitive measures.  Risks of infection, DVT, component failure, need for revision surgery were discussed and reviewed, in addition to the hospital course and postop course expectations.  Consent was obtained for benefit of pain relief.  PROCEDURE IN DETAIL:  The patient was brought to operative theater. Once adequate anesthesia, preoperative antibiotics, Ancef administered, the patient was positioned supine with left thigh tourniquet placed. Left lower extremity was prepped and draped in a sterile fashion.  Time- out was performed identifying the patient, planned procedure, and extremity.  Leg was exsanguinated, tourniquet elevated to 250 mmHg.  Midline incision was made followed by median arthrotomy.  Following initial exposure and debridement, attention was first directed to the patella, precut measurement was noted to be about 21 mm to 22 mm.  I  resected down to 14 mm and used a 35 patellar button and restored height.  Lug holes were drilled and a metal shim placed to protect the patella from retractors and saw blades.  Attention now directed to femur.  Femoral canal was opened with a drill. Irrigated to try to prevent fat emboli.  An intramedullary rod was passed at 3 degrees of valgus, 10 mm of bone resected off the distal femur.  Following this resection, tibia was subluxated anteriorly.  The median lateral meniscus and cruciate stumps were debrided.  Using extramedullary guide, 10 mm of bone was resected based off the proximal lateral tibia.  At this point, we confirmed the gap would be stable with at least 10 mm insert medially and laterally and that the cut was perpendicular in the coronal points, rotation of the femoral canal with the bone was going to be based off this.  I then sized the femur at this size 2.  The size 2 block was then pinned into position anterior reference using a C clamp set rotation of the proximal tibia.  The former cutting block was then pinned into position. The anterior-posterior chamfer cuts were made followed by the box cut at the lateral aspect of the distal femur.  The tibia was then subluxated anteriorly and was seen in the best fit with size 1.5 tray in place  of 2, however, the 2 tray was pinned into position with a little bit of overhang medially and laterally.  The area was then drilled and keel punch trial reduction was carried out with 2 femur, 2 tibia, and a 12.5 insert.  With this, the knee came to full extension with stable from extension and flexion with the patella tracking through the trochlea well.  At this point, all trial components removed.  The knee was irrigated with normal saline solution and pulse lavage and the synovial capsule junction was injected with 0.25% Marcaine with epinephrine and  mL of Toradol.  Final components were opened, holding polyethylene insert,  particularly the 1.5 tibial tray.  The final components were then cemented onto clean and dried cut surfaces of bone.  The knee was brought to extension with 12.5 insert and extruded cement was removed.  Once the cement had fully cured, an excessive cement was removed throughout the knee.  The final 12.5 insert was chosen and placed in the knee.  Medium Hemovac drain was placed deep.  The extensor mechanism was then reapproximated after irrigating the knee again with #1 Vicryl with the knee in flexion.  The remainder of the wound was closed in layers with 2-0 Vicryl and running 4-0 Monocryl.  The knee was cleaned, dried, and dressed sterilely using Dermabond and Aquacel dressing.  The drain site was dressed separately.  The patient was then brought to recovery room in stable condition, tolerating the procedure well.     [name], M.D.     MDO/MEDQ  D:  09/17/2010  T:  09/18/2010  Job:  [id number]  Electronically Signed by Durene Romans M.D. on 09/18/2010 01:53:32 PM

## 2010-09-19 LAB — CBC
HCT: 26.1 % — ABNORMAL LOW (ref 36.0–46.0)
MCV: 98.9 fL (ref 78.0–100.0)
RDW: 13.6 % (ref 11.5–15.5)
WBC: 7.9 10*3/uL (ref 4.0–10.5)

## 2010-09-19 LAB — BASIC METABOLIC PANEL
BUN: 8 mg/dL (ref 6–23)
Chloride: 102 mEq/L (ref 96–112)
Creatinine, Ser: 0.55 mg/dL (ref 0.50–1.10)
GFR calc Af Amer: >60 mL/min (ref >60)

## 2010-09-23 NOTE — Discharge Summary (Signed)
NAMEMAELIE, CHRISWELL NO.:  [id number]  MEDICAL RECORD NO.:  [id number]  LOCATION:  1412                         FACILITY:  [facility]  PHYSICIAN:  Madlyn Frankel. Charlann Boxer, M.D.  DATE OF BIRTH:  10-05-1935  DATE OF ADMISSION:  09/17/2010 DATE OF DISCHARGE:  09/20/2010                        DISCHARGE SUMMARY - REFERRING   PROCEDURE:  Left total knee replacement.  ADMITTING DIAGNOSIS:  Left knee osteoarthritis.  DISCHARGE DIAGNOSES: 1. Status post left total knee arthroplasty. 2. History of seizures. 3. Asthma. 4. Anxiety. 5. Blood pressure. 6. Hyperlipidemia. 7. Gastroesophageal reflux disease. 8. Irritable bowel syndrome. 9. Diverticulosis.  HISTORY OF PRESENT ILLNESS:  The patient is a 75 year old white female who is complaining of left knee pain for the past 6+ years.  She has been seen about 6 years ago when she was told that she should consider a knee replacement.  Since that time, she has been living with the pain. She initially started getting steroid injections which did help alleviate symptoms at first, but over the years has gradually decreased in effectiveness.  Her last injection lasted only a minimal time.  X- rays in the clinic did show arthritis of the left knee.  Various options were discussed with the patient.  The patient wishes to proceed with surgery.  Risks, benefits, and expectations of the procedure were discussed with the patient.  The patient understands the risks, benefits, and expectations, and wishes to proceed with left total knee replacement.  HOSPITAL COURSE:  The patient underwent the above-stated procedure on September 17, 2010.  The patient tolerated the procedure well, was brought to the recovery room in good condition and subsequently to the floor.  On postoperative day #1, the patient did have a drop of hemoglobin to 8.3 which Cardiology diagnosed with acute blood loss anemia and the patient was not given any transfusions.  The  patient did have a near- syncopal episode and her blood pressure was low at 96/58.  Cardiology has held her ACE inhibitor at that time and blood pressure medicines were changed.  The patient was also given a fluid bolus to help increase her blood pressure; otherwise, she only complained of minimal pain, afebrile, and vital signs are stable.  She is distally neurovascularly intact and dressing looked good and Hemovac drain was removed and the patient started physical therapy.  On postoperative day #2, September 19, 2010, the patient did okay and there are no major complaints, afebrile, and vital signs are stable. Hematocrit was 26.1.  The left knee was clean, dry, and intact.  The patient again will have physical therapy.  The patient does state that her pain feels well controlled.  On postoperative day #3, September 20, 2010, the patient doing well, no events.  Labs were drawn, afebrile, and vital signs were stable.  Left knee was good, clean, dry, and intact. It was felt that the patient was doing well enough and she want to get into a more prominent environment and she would like to be discharged.  DISCHARGE CONDITION:  Good.  DISCHARGE INSTRUCTIONS:  The patient is felt to be well enough to be discharged to skilled nursing facility on September 20, 2010.  The patient  will be weightbearing as tolerated.  The patient should maintain her surgical dressing for about 8 days after which she can change it out with gauze and tape and the area should be kept dry and clean until followup.  The patient will follow up with Dr. Charlann Boxer at [facility] in 2 weeks.  The patient is to call with any concerns or questions.  DISCHARGE MEDICATIONS: 1. Benadryl 25 mg 1 p.o. q.4 h. p.r.n. 2. Colace 100 mg 1 p.o. b.i.d. p.r.n. constipation. 3. Iron sulfate 325 mg 1 p.o. t.i.d. x2 to 3 weeks. 4. Norco 7.5/325 mg 1-2 p.o. q.4-6 h. p.r.n. pain. 5. MiraLax 17 g 1 p.o. daily p.r.n. constipation. 6. Robaxin  500 mg 1 p.o. q.6 h. p.r.n. muscle spasms. 7. Metoprolol 12.5 mg 1 p.o. b.i.d. 8. Aspirin enteric-coated 81 mg 1 p.o. daily. 9. Atorvastatin 40 mg 1 p.o. q.h.s. 10.Calcium plus vitamin D 1 p.o. daily. 11.Carbamazepine 300 mg 1 p.o. b.i.d. (do not substitute). 12.Multivitamin 1 p.o. daily. 13.Vicodin. 14.Plavix 75 mg 1 p.o. q.h.s. 15.Prolia 60 mg/mL 1 injection subcutaneous twice a year.    ______________________________ Lanney Gins, PA   ______________________________ Madlyn Frankel. Charlann Boxer, M.D.    MB/MEDQ  D:  09/20/2010  T:  09/20/2010  Job:  [id number]  cc:   Lelon Perla, DO [address] [place], [place] [address]  Electronically Signed by Lanney Gins PA on 09/23/2010 09:57:56 AM Electronically Signed by Durene Romans M.D. on 09/23/2010 05:35:59 PM

## 2010-09-24 NOTE — Consult Note (Signed)
[name], LANPHEAR NO.:  [id number]  MEDICAL RECORD NO.:  [id number]  LOCATION:  1412                         FACILITY:  [facility]  PHYSICIAN:  Madolyn Frieze. Jens Som, MD, FACCDATE OF BIRTH:  September 09, 1935  DATE OF CONSULTATION:  09/17/2010 DATE OF DISCHARGE:                                CONSULTATION   PRIMARY CARE PHYSICIAN:  Lelon Perla, DO.  PRIMARY CARDIOLOGIST:  None.  CHIEF COMPLAINT:  Abnormal EKG.  HISTORY OF PRESENT ILLNESS:  [name] is a 75 year old female with a past medical history significant for hypertension, hyperlipidemia, CVA, seizure disorder, and a systolic murmur, who we are asked to evaluate for EKG changes.  She had a total left knee replacement today.  In the emergency room, she has a possible ST depression.  Cardiology was asked to evaluate her.  [name] denies chest pain or shortness of breath.  Her activity was significantly limited by her joint problems prior to admission, but she has never had exertional symptoms.  She denies orthopnea or PND.  The murmur was diagnosed by Primary Care several years ago.  There was also concern for mitral valve prolapse, but according to family this was disproved by remote echo.  She denies any shortness of breath or chest pain postoperatively and currently is resting comfortably with only a small amount of pain currently from her left knee.  PAST MEDICAL HISTORY: 1. CVA. 2. Subsequent seizures. 3. Asthma. 4. Hypertension. 5. Hyperlipidemia. 6. Family history of coronary artery disease. 7. Gastroesophageal reflux disease. 8. Irritable bowel syndrome and diverticulosis. 9. Osteoporosis. 10.History of strep in 2000.  SURGICAL HISTORY:  She is status post left total knee replacement today as well as laparoscopic cholecystectomy.  ALLERGIES:  No known drug allergies.  CURRENT MEDICATIONS: 1. Aspirin 81 mg a day. 2. Lotensin 10 mg a day. 3. Ancef. 4. Plavix 75 mg a day. 5. Colace  100 mg b.i.d. 6. Iron t.i.d. 7. Vicodin p.r.n. 8. Carbatrol ER 300 mg b.i.d. 9. Crestor 20 mg q.h.s. 10.IV fluids 100 cc an hour.  SOCIAL HISTORY:  She lives in [place] and lives alone.  Her daughter lives nearby and will help care for her after discharge.  She denies any history of alcohol, tobacco, or drug abuse.  She is retired.  FAMILY HISTORY:  Both of her parents are deceased.  Her father had coronary artery disease in his 16s or 70s and her brother had coronary artery disease diagnosed in his 43s as well.  REVIEW OF SYSTEMS:  Significant for arthralgias and joint pains.  She was ambulating with a cane prior to admission.  She had a significant limp because of the pain in her knee.  She has not had any recent illnesses, fever, or chills.  She has not had any melena.  She has not had any bleeding, which she is aware.  Full 14-point review of systems is otherwise negative except as stated in the HPI.  PHYSICAL EXAMINATION:  VITAL SIGNS:  Temperature is 98.1, blood pressure 134/83, heart rate 77, respiratory rate 14, O2 saturation 100% on 2 L. GENERAL:  She is a frail elderly white female, in no acute distress. HEENT:  Normal.  NECK:  There is no lymphadenopathy, thyromegaly, bruit, or JVD noted. CV:  Heart is regular in rate and rhythm with an S1, S2 and a soft systolic murmur is noted at the right upper sternal border.  Distal pulses are intact in all four extremities. LUNGS:  Essentially clear to auscultation bilaterally. SKIN:  No rashes or lesions are noted.  Her incision is bandaged, dressed, and not disturbed. ABDOMEN:  Soft and nontender with active bowel sounds. EXTREMITIES:  There is no cyanosis or clubbing noted. MUSCULOSKELETAL:  There is no joint deformities noted. NEUROLOGIC:  She is alert and oriented with cranial nerves II through XII grossly intact.  IMAGING STUDIES:  Chest x-ray performed on September 10, 2010, shows no acute abnormalities.  EKG is sinus  rhythm rate 71 with T-wave abnormality, consider lateral ischemia.  This is slightly different from an EKG dated September 10, 2010.  LABORATORY VALUES:  CK-MB 130/4.0 with an index of 3.1 with troponin I of less than 0.30.  IMPRESSION:  [name] was seen today by Dr. Jens Som, the patient evaluated and the data reviewed.  Ms. Abadi had possible ST depression while in OR.  She denies chest pain or shortness of breath.  Her EKG is sinus rhythm, but T-wave inversions are slightly increased compared to previous.  Initial enzymes are negative, although the index is slightly elevated.  We await follow-up enzymes.  If they remain negative then no further inpatient workup is indicated.  Because of her 2/6 systolic murmur on exam, there is concern for mild AS.  She will follow up with Dr. Jens Som and an appointment has been arranged for September.  At that time, she will get an echocardiogram.  We will continue to follow her with you.  She should remain on telemetry for 24 hours until cardiac enzymes have been cycled.     Theodore Demark, PA-C   ______________________________ Madolyn Frieze. Jens Som, MD, [facility]    RB/MEDQ  D:  09/17/2010  T:  09/18/2010  Job:  [id number]  Electronically Signed by Theodore Demark PA-C on 09/19/2010 07:59:01 PM Electronically Signed by Olga Millers MD [facility] on 09/24/2010 01:09:37 PM

## 2010-10-25 ENCOUNTER — Encounter: Payer: Medicare Other | Admitting: Cardiology

## 2010-10-25 ENCOUNTER — Other Ambulatory Visit (HOSPITAL_COMMUNITY): Payer: Medicare Other | Admitting: Radiology

## 2010-10-25 ENCOUNTER — Encounter: Payer: Self-pay | Admitting: Cardiology

## 2010-10-25 ENCOUNTER — Ambulatory Visit (INDEPENDENT_AMBULATORY_CARE_PROVIDER_SITE_OTHER): Payer: Medicare Other | Admitting: Cardiology

## 2010-10-25 DIAGNOSIS — I10 Essential (primary) hypertension: Secondary | ICD-10-CM

## 2010-10-25 DIAGNOSIS — E785 Hyperlipidemia, unspecified: Secondary | ICD-10-CM

## 2010-10-25 DIAGNOSIS — R9431 Abnormal electrocardiogram [ECG] [EKG]: Secondary | ICD-10-CM

## 2010-10-25 NOTE — Progress Notes (Signed)
HPI:75 yo female I initially saw in the hospital in August of 2012 following knee surgery for abnormal ECG and murmur. Echo in August of 2012 revealed normal LV function and grade 1 diastolic dysfunction. Pt had negative enzymes. Plan was for consideration of outpatient myoview after she recovered from knee surgery. Since discharge, she denies dyspnea on exertion, orthopnea, chest pain or syncope. Mild edema in the left lower extremity where her knee was replaced.  Current Outpatient Prescriptions  Medication Sig Dispense Refill  . aspirin 81 MG tablet Take 81 mg by mouth daily.        Marland Kitchen atorvastatin (LIPITOR) 40 MG tablet Take 1 tablet (40 mg total) by mouth at bedtime.  30 tablet  1  . Calcium Carbonate-Vitamin D (CALCIUM + D) 600-200 MG-UNIT TABS Take 1 tablet by mouth daily.        . carbamazepine (CARBATROL) 300 MG 12 hr capsule Take 1 capsule (300 mg total) by mouth 2 (two) times daily.  60 capsule  1  . clopidogrel (PLAVIX) 75 MG tablet Take 1 tablet (75 mg total) by mouth daily.  30 tablet  1  . denosumab (PROLIA) 60 MG/ML SOLN Inject 60 mg into the skin once.        Marland Kitchen HYDROcodone-acetaminophen (NORCO) 7.5-325 MG per tablet As needed      . methocarbamol (ROBAXIN) 500 MG tablet Take 1 tablet by mouth At bedtime as needed. As needed      . metoprolol tartrate (LOPRESSOR) 25 MG tablet Take 0.5 tablets by mouth Twice daily.      . Multiple Vitamin (ONE-A-DAY 55 PLUS PO) Take 1 tablet by mouth daily.           Past Medical History  Diagnosis Date  . Hyperlipidemia   . Seizures   . Cerebrovascular accident 2000  . Osteoporosis   . Hypertension     Past Surgical History  Procedure Date  . Cholecystectomy   . Breast lumpectomy 1975    Left  . Tonsillectomy     History   Social History  . Marital Status: Divorced    Spouse Name: N/A    Number of Children: N/A  . Years of Education: N/A   Occupational History  . Not on file.   Social History Main Topics  . Smoking status:  Never Smoker   . Smokeless tobacco: Never Used  . Alcohol Use: No  . Drug Use: No  . Sexually Active: Not on file   Other Topics Concern  . Not on file   Social History Narrative  . No narrative on file    ROS: no fevers or chills, productive cough, hemoptysis, dysphasia, odynophagia, melena, hematochezia, dysuria, hematuria, rash, seizure activity, orthopnea, PND, pedal edema, claudication. Remaining systems are negative.  Physical Exam: Well-developed well-nourished in no acute distress.  Skin is warm and dry.  HEENT is normal.  Neck is supple. No thyromegaly.  Chest is clear to auscultation with normal expansion.  Cardiovascular exam is regular rate and rhythm. 2/6 systolic ejection murmur. Abdominal exam nontender or distended. No masses palpated. Extremities show status post knee replacement on the left. Incision without evidence of infection. neuro grossly intact

## 2010-10-25 NOTE — Assessment & Plan Note (Signed)
Continue present medications. Blood pressure controlled today.

## 2010-10-25 NOTE — Assessment & Plan Note (Signed)
Management per primary care. 

## 2010-10-25 NOTE — Patient Instructions (Signed)
Your physician has requested that you have a lexiscan myoview. For further information please visit [email or web address]. Please follow instruction sheet, as given.   

## 2010-10-25 NOTE — Assessment & Plan Note (Signed)
Patient had electrocardiographic changes while being monitored in the operating room during her knee replacement. Her cardiac markers were negative. Plan lexiscan myoview to exclude ischemia.

## 2010-10-31 ENCOUNTER — Encounter: Payer: Self-pay | Admitting: Family Medicine

## 2010-10-31 ENCOUNTER — Ambulatory Visit (INDEPENDENT_AMBULATORY_CARE_PROVIDER_SITE_OTHER): Payer: Medicare Other | Admitting: Family Medicine

## 2010-10-31 VITALS — BP 140/78 | HR 74 | Temp 98.7°F | Wt 106.8 lb

## 2010-10-31 DIAGNOSIS — I10 Essential (primary) hypertension: Secondary | ICD-10-CM

## 2010-10-31 DIAGNOSIS — E785 Hyperlipidemia, unspecified: Secondary | ICD-10-CM

## 2010-10-31 DIAGNOSIS — Z23 Encounter for immunization: Secondary | ICD-10-CM

## 2010-10-31 DIAGNOSIS — D649 Anemia, unspecified: Secondary | ICD-10-CM

## 2010-10-31 LAB — CBC WITH DIFFERENTIAL/PLATELET
Basophils Absolute: 0 10*3/uL (ref 0.0–0.1)
Eosinophils Absolute: 0.1 10*3/uL (ref 0.0–0.7)
HCT: 41.6 % (ref 36.0–46.0)
Lymphs Abs: 1.9 10*3/uL (ref 0.7–4.0)
MCHC: 32.1 g/dL (ref 30.0–36.0)
MCV: 108.6 fl — ABNORMAL HIGH (ref 78.0–100.0)
Monocytes Absolute: 0.5 10*3/uL (ref 0.1–1.0)
Monocytes Relative: 7.4 % (ref 3.0–12.0)
Platelets: 346 10*3/uL (ref 150.0–400.0)
RDW: 15.2 % — ABNORMAL HIGH (ref 11.5–14.6)

## 2010-10-31 NOTE — Patient Instructions (Signed)
Anemia and Aging   Hemoglobin is the protein in your blood that carries oxygen. If this protein becomes too low, you are anemic. Usually mild anemia (low red blood cells) may not be noticeable. However, if it becomes more severe, you may feel tired, short of breath with activity, and may experience fainting or palpitations (heart beats you can feel). As many as 61% of elderly people have anemia. This depends on age, sex, and overall health. The chance of becoming anemic increases after age 65 and is greater in men.  CAUSES  There are many causes of anemia. Studies have shown the 2 major causes of anemia in the elderly are:    Chronic (long standing) disease.    Anemia of chronic disease: Certain chronic diseases can interfere with the production of red blood cells, resulting in chronic anemia. The kidneys produce a hormone called erythropoietin which stimulates your bone marrow to produce red blood cells. Bone marrow is the soft, spongy tissue found in bone cavities which makes and stores most of the red blood cells.    Iron is also needed for red blood cell production. In anemia of chronic disease a body can not use its stored iron. Erythropoietin is suppressed (held down from working) and the bone marrow does not respond normally. A shortage of iron and erythropoietin can result in a shortage of red blood cells.    Iron deficiency.    Iron deficiency anemia can result from chronic, often undetected blood loss. Another cause is from not eating enough iron in your diet, or when your body is not absorbing enough iron. About 20% to 36% of elderly people have idiopathic anemia. This means no cause for the anemia can be found.   EFFECTS OF UNTREATED ANEMIA  Elderly people with anemia are 40% more likely to have problems that keep them from being independent. Some of these problems include:   Poor balance.    Falling more often.    Heart trouble.    Depression.    Problems with memory and concentration.    Not  being able to walk long distances.   Anemia has been shown to shorten the life expectancy of elderly people. As early as age 65, people with kidney disease, diabetes, and/or heart failure are more likely to die if they have anemia. Anemia can also make certain medical conditions worse. Getting treatment for anemia is important. This does not guarantee you will live longer if the anemia is corrected, but it this information shows how important it is to treat your anemia.   SYMPTOMS  Most people do not realize they are anemic until a blood test is done. Symptoms and signs usually develop when anemia is moderate to severe. Symptoms can include:   Fatigue.   Weakness.    Pale skin.    Chest pain.    Dizziness.   Irritability.   Trouble breathing.    A fast heartbeat.    Headaches.    Numbness or coldness in your hands and feet.    One of the problems in identifying anemia in the elderly is that many of these symptoms are typically associated with getting older. It is important to see your caregiver on a regular basis in order to be tested for anemia.   TREATMENT  Your caregiver will provide the treatment that is best for you based on what is causing the anemia. Several medications are approved to help correct anemia. Close communication with your caregiver will help you   to receive the best anemia treatment available.   Normal Lab Values:   Normal hemoglobin is greater than 12 g/dL for women, with a hematocrit which is greater than 37%.  Normal hemoglobin is greater than 14 g/dL for men, with a normal hematocrit which is greater than 42%.  Document Released: 08/29/2003 Document Re-Released: 07/10/2009  [employer] Patient Information 2011 [employer].

## 2010-10-31 NOTE — Progress Notes (Signed)
  Subjective:    Patient ID: [name], female    DOB: Feb 10, 1935, 75 y.o.   MRN: [id number]  HPI Pt here to f/u from surgery and release from rehab.  Pt was anemic in hospital. Labs need to be done.  She has already had f/u w/ cardio.   No complaints.  Pt was released to drive but has not yet.  Her daughter is with her today.   Review of Systems As above    Objective:   Physical Exam  Constitutional: She is oriented to person, place, and time. She appears well-developed and well-nourished.  Cardiovascular: Normal rate and regular rhythm.   Musculoskeletal: She exhibits no edema and no tenderness.  Neurological: She is alert and oriented to person, place, and time.  Skin: Skin is warm.  Psychiatric: She has a normal mood and affect. Her behavior is normal. Judgment and thought content normal.          Assessment & Plan:  Htn--- stable Hyperlipidemia-- con't meds Anemia--check labs today

## 2010-11-01 LAB — IBC PANEL: Iron: 113 ug/dL (ref 42–145)

## 2010-11-06 ENCOUNTER — Encounter: Payer: Self-pay | Admitting: *Deleted

## 2010-11-12 ENCOUNTER — Other Ambulatory Visit: Payer: Self-pay | Admitting: Family Medicine

## 2010-11-15 ENCOUNTER — Encounter: Payer: Self-pay | Admitting: Internal Medicine

## 2010-11-15 ENCOUNTER — Ambulatory Visit (INDEPENDENT_AMBULATORY_CARE_PROVIDER_SITE_OTHER): Payer: Medicare Other | Admitting: Internal Medicine

## 2010-11-15 DIAGNOSIS — T148XXA Other injury of unspecified body region, initial encounter: Secondary | ICD-10-CM

## 2010-11-15 DIAGNOSIS — S0181XA Laceration without foreign body of other part of head, initial encounter: Secondary | ICD-10-CM

## 2010-11-15 DIAGNOSIS — Z0000 Encounter for general adult medical examination without abnormal findings: Secondary | ICD-10-CM

## 2010-11-15 DIAGNOSIS — S0180XA Unspecified open wound of other part of head, initial encounter: Secondary | ICD-10-CM

## 2010-11-15 DIAGNOSIS — Z23 Encounter for immunization: Secondary | ICD-10-CM

## 2010-11-15 NOTE — Progress Notes (Signed)
  Subjective:    Patient ID: [name], female    DOB: 1935/09/23, 75 y.o.   MRN: [id number]  HPI Injury in her left temple yesterday, see nurse's notes. Likes to be sure is ok. Here w/ Misty Stanley, her daughter   Past Medical History  Diagnosis Date  . Hyperlipidemia   . Seizures   . Cerebrovascular accident 2000  . Osteoporosis   . Hypertension    Past Surgical History  Procedure Date  . Cholecystectomy   . Breast lumpectomy 1975    Left  . Tonsillectomy     Review of Systems Denies any loss of consciousness No diplopia or blurred vision No actual pain in the area of the impact.    Objective:   Physical Exam  Constitutional: She appears well-developed and well-nourished. No distress.  HENT:  Head:    Eyes: Conjunctivae and EOM are normal. Pupils are equal, round, and reactive to light. Right eye exhibits no discharge. Left eye exhibits no discharge.       Orbits w/ deformities, no crepitus  Skin: She is not diaphoretic.          Assessment & Plan:  Hematoma, echymosis: Fortunately , eyes are normal, recommend to keep the area clean and dry, use over-the-counter antibiotic ointments. I anticipate a ecchymosis and hematoma will travel down to her face. Will call is visual disturbances, or if the area gets red and hot. We'll give her a tetanus shot, last was almost 10 years ago.

## 2010-11-25 ENCOUNTER — Ambulatory Visit (HOSPITAL_COMMUNITY): Payer: Medicare Other | Attending: Cardiology | Admitting: Radiology

## 2010-11-25 DIAGNOSIS — R079 Chest pain, unspecified: Secondary | ICD-10-CM

## 2010-11-25 DIAGNOSIS — I10 Essential (primary) hypertension: Secondary | ICD-10-CM

## 2010-11-25 DIAGNOSIS — R0602 Shortness of breath: Secondary | ICD-10-CM

## 2010-11-25 DIAGNOSIS — R0609 Other forms of dyspnea: Secondary | ICD-10-CM

## 2010-11-25 DIAGNOSIS — R9431 Abnormal electrocardiogram [ECG] [EKG]: Secondary | ICD-10-CM

## 2010-11-25 MED ORDER — REGADENOSON 0.4 MG/5ML IV SOLN
0.4000 mg | Freq: Once | INTRAVENOUS | Status: AC
  Administered 2010-11-25: 0.4 mg via INTRAVENOUS

## 2010-11-25 MED ORDER — TECHNETIUM TC 99M TETROFOSMIN IV KIT
11.0000 | PACK | Freq: Once | INTRAVENOUS | Status: AC | PRN
  Administered 2010-11-25: 11 via INTRAVENOUS

## 2010-11-25 MED ORDER — TECHNETIUM TC 99M TETROFOSMIN IV KIT
33.0000 | PACK | Freq: Once | INTRAVENOUS | Status: AC | PRN
  Administered 2010-11-25: 33 via INTRAVENOUS

## 2010-11-25 NOTE — Progress Notes (Signed)
[facility] SITE 3 NUCLEAR MED [address] [place] [address] [phone]  Cardiology Nuclear Med Study  [name] is a 75 y.o. female [id number] 1936-02-01   Nuclear Med Background Indication for Stress Test:  Evaluation for Ischemia and Abnormal EKG History:  Asthma, 08/12 Echo: EF 60-65% NL and MVP Cardiac Risk Factors: CVA, Family History - CAD and Lipids  Symptoms:  Chest Pain, DOE, Palpitations and SOB   Nuclear Pre-Procedure Caffeine/Decaff Intake:  None NPO After: 10:00pm   Lungs: clear IV 0.9% NS with Angio Cath:  22g  IV Site: R Forearm  IV Started by:  Stanton Kidney, EMT-P  Chest Size (in):  36 Cup Size: B  Height: 4' 9.75" (1.467 m)  Weight:  106 lb (48.081 kg)  BMI:  Body mass index is 22.35 kg/(m^2). Tech Comments:  Lopressor held this am, per patient.    Nuclear Med Study 1 or 2 day study: 1 day  Stress Test Type:  Eugenie Birks  Reading MD: Dietrich Pates, MD  Order Authorizing Provider:  B.Crenshaw  Resting Radionuclide: Technetium 77m Tetrofosmin  Resting Radionuclide Dose: 11 mCi   Stress Radionuclide:  Technetium 51m Tetrofosmin  Stress Radionuclide Dose: 33 mCi           Stress Protocol Rest HR: 71 Stress HR: 107  Rest BP: 157/74 Stress BP: 161/65  Exercise Time (min): n/a METS: n/a   Predicted Max HR: 147 bpm % Max HR: 72.79 bpm Rate Pressure Product: 46962   Dose of Adenosine (mg):  n/a Dose of Lexiscan: 0.4 mg  Dose of Atropine (mg): n/a Dose of Dobutamine: n/a mcg/kg/min (at max HR)  Stress Test Technologist: Milana Na, EMT-P  Nuclear Technologist:  Domenic Polite, CNMT     Rest Procedure:  Myocardial perfusion imaging was performed at rest 45 minutes following the intravenous administration of Technetium 76m Tetrofosmin. Rest ECG: NSR  Stress Procedure:  The patient received IV Lexiscan 0.4 mg over 15-seconds.  Technetium 4m Tetrofosmin injected at 30-seconds.  There were non specific changes, abdiminal  pain,sob,hot, and feels weird with Lexiscan.  Quantitative spect images were obtained after a 45 minute delay. Stress ECG: No significant change from baseline ECG  QPS Raw Data Images: Rest images were motion corrected. Stress Images:  Normal homogeneous uptake in all areas of the myocardium. Rest Images:  Normal homogeneous uptake in all areas of the myocardium. Subtraction (SDS):  No evidence of ischemia. Transient Ischemic Dilatation (Normal <1.22):  .81 Lung/Heart Ratio (Normal <0.45):  .20  Quantitative Gated Spect Images QGS EDV:  40 ml QGS ESV:  7 ml QGS cine images:  NL LV Function; NL Wall Motion QGS EF: 83%  Impression Exercise Capacity:  Lexiscan with no exercise. BP Response:  Normal blood pressure response. Clinical Symptoms:  No chest pain. ECG Impression:  No significant ST segment change suggestive of ischemia. Comparison with Prior Nuclear Study: No previous test to compare.  Overall Impression:  Normal stress nuclear study.  LVEF greater than 70% Dietrich Pates

## 2010-11-29 ENCOUNTER — Ambulatory Visit (INDEPENDENT_AMBULATORY_CARE_PROVIDER_SITE_OTHER): Payer: Medicare Other

## 2010-11-29 ENCOUNTER — Other Ambulatory Visit (INDEPENDENT_AMBULATORY_CARE_PROVIDER_SITE_OTHER): Payer: Medicare Other

## 2010-11-29 DIAGNOSIS — E785 Hyperlipidemia, unspecified: Secondary | ICD-10-CM

## 2010-11-29 DIAGNOSIS — M810 Age-related osteoporosis without current pathological fracture: Secondary | ICD-10-CM

## 2010-11-29 DIAGNOSIS — D649 Anemia, unspecified: Secondary | ICD-10-CM

## 2010-11-29 LAB — LIPID PANEL
Cholesterol: 182 mg/dL (ref 0–200)
HDL: 98.9 mg/dL (ref >39.00)
VLDL: 26.4 mg/dL (ref 0.0–40.0)

## 2010-11-29 LAB — CBC WITH DIFFERENTIAL/PLATELET
Basophils Absolute: 0 10*3/uL (ref 0.0–0.1)
Hemoglobin: 13.9 g/dL (ref 12.0–15.0)
Lymphocytes Relative: 25.7 % (ref 12.0–46.0)
Monocytes Relative: 6.9 % (ref 3.0–12.0)
Neutro Abs: 4.4 10*3/uL (ref 1.4–7.7)
Platelets: 316 10*3/uL (ref 150.0–400.0)
RDW: 14.5 % (ref 11.5–14.6)
WBC: 6.7 10*3/uL (ref 4.5–10.5)

## 2010-11-29 LAB — BASIC METABOLIC PANEL
Calcium: 9.2 mg/dL (ref 8.4–10.5)
GFR: 110.35 mL/min (ref >60.00)
Glucose, Bld: 114 mg/dL — ABNORMAL HIGH (ref 70–99)
Sodium: 139 mEq/L (ref 135–145)

## 2010-11-29 LAB — HEPATIC FUNCTION PANEL
AST: 22 U/L (ref 0–37)
Bilirubin, Direct: 0 mg/dL (ref 0.0–0.3)
Total Bilirubin: 0.3 mg/dL (ref 0.3–1.2)

## 2010-11-29 MED ORDER — DENOSUMAB 60 MG/ML ~~LOC~~ SOLN
60.0000 mg | Freq: Once | SUBCUTANEOUS | Status: AC
  Administered 2010-11-29: 60 mg via SUBCUTANEOUS

## 2010-11-29 NOTE — Progress Notes (Signed)
Labs only

## 2010-12-11 ENCOUNTER — Other Ambulatory Visit: Payer: Self-pay | Admitting: Family Medicine

## 2010-12-24 ENCOUNTER — Other Ambulatory Visit: Payer: Self-pay | Admitting: Family Medicine

## 2010-12-24 NOTE — Telephone Encounter (Signed)
Pt advise that urine will need to be check prior to any med being Rx. Pt aware OV schedule.

## 2010-12-25 ENCOUNTER — Ambulatory Visit (INDEPENDENT_AMBULATORY_CARE_PROVIDER_SITE_OTHER): Payer: Medicare Other | Admitting: Family Medicine

## 2010-12-25 ENCOUNTER — Encounter: Payer: Self-pay | Admitting: Family Medicine

## 2010-12-25 VITALS — BP 122/80 | HR 85 | Temp 98.1°F | Wt 112.0 lb

## 2010-12-25 DIAGNOSIS — N390 Urinary tract infection, site not specified: Secondary | ICD-10-CM

## 2010-12-25 DIAGNOSIS — R81 Glycosuria: Secondary | ICD-10-CM

## 2010-12-25 DIAGNOSIS — R7309 Other abnormal glucose: Secondary | ICD-10-CM

## 2010-12-25 LAB — POCT URINALYSIS DIPSTICK
Ketones, UA: NEGATIVE
Spec Grav, UA: 1.01
pH, UA: 7

## 2010-12-25 LAB — BASIC METABOLIC PANEL
CO2: 32 mEq/L (ref 19–32)
Chloride: 100 mEq/L (ref 96–112)
GFR: 79.16 mL/min (ref >60.00)
Glucose, Bld: 106 mg/dL — ABNORMAL HIGH (ref 70–99)
Potassium: 5.7 mEq/L — ABNORMAL HIGH (ref 3.5–5.1)
Sodium: 139 mEq/L (ref 135–145)

## 2010-12-25 MED ORDER — CIPROFLOXACIN HCL 500 MG PO TABS: 500.0000 mg | ORAL_TABLET | Freq: Two times a day (BID) | ORAL | Status: DC

## 2010-12-25 NOTE — Patient Instructions (Signed)

## 2010-12-25 NOTE — Progress Notes (Signed)
  Subjective:    [name] is a 75 y.o. female who complains of frequency and suprapubic pressure. She has had symptoms for since last visit -. Patient also complains of none. Patient denies back pain, congestion, cough, fever, headache, rhinitis, sorethroat, stomach ache and vaginal discharge. Patient does have a history of recurrent UTI. Patient does not have a history of pyelonephritis.   The following portions of the patient's history were reviewed and updated as appropriate: allergies, current medications, past family history, past medical history, past social history, past surgical history and problem list.  Review of Systems Pertinent items are noted in HPI.    Objective:    BP 122/80  Pulse 85  Temp(Src) 98.1 F (36.7 C) (Oral)  Wt 112 lb (50.803 kg)  SpO2 96% General appearance: alert, cooperative and appears stated age Abdomen: soft, non-tender; bowel sounds normal; no masses,  no organomegaly  Laboratory:  Urine dipstick: mod for glucose, trace for hemoglobin, moderate for leukocyte esterase and trace for protein.   Micro exam: not done.   cbg--99 Assessment:    dysuria     Plan:    Medications: ciprofloxacin. Maintain adequate hydration. Follow up if symptoms not improving, and as needed.

## 2010-12-25 NOTE — Progress Notes (Signed)
Addended by: Arnette Norris on: 12/25/2010 03:06 PM   Modules accepted: Orders

## 2010-12-27 ENCOUNTER — Telehealth: Payer: Self-pay | Admitting: Family Medicine

## 2010-12-27 MED ORDER — NITROFURANTOIN MONOHYD MACRO 100 MG PO CAPS: 100.0000 mg | ORAL_CAPSULE | Freq: Two times a day (BID) | ORAL | Status: DC

## 2010-12-27 NOTE — Telephone Encounter (Signed)
Last OV 12/25/10

## 2010-12-27 NOTE — Telephone Encounter (Signed)
Dx w/ a UTI, Rx cipro, UCX pending. There is a theoretical risk of interaction between cipro and carbatrol (per Uptodate);  D/c cipro, call macrobid 100 mg 1 po bid x 3 days

## 2010-12-27 NOTE — Telephone Encounter (Signed)
Spoke with patient, all information ok'd. New rx sent in

## 2010-12-29 LAB — URINE CULTURE: Colony Count: 40000

## 2010-12-30 ENCOUNTER — Telehealth: Payer: Self-pay | Admitting: Family Medicine

## 2010-12-30 NOTE — Telephone Encounter (Signed)
Left message to call office per note 12-27-10 Pt to D/c cipro, call macrobid 100 mg 1 po bid x 3 days.

## 2010-12-30 NOTE — Telephone Encounter (Signed)
new rx was called in but for 3 days - patient thought she  have 4 days of the med - needs 1 more pill --- she took 1 day of the med she discontinued - 3 days of the new med  -[facility]

## 2010-12-30 NOTE — Telephone Encounter (Signed)
Discuss with patient. Pt denies being symptomatic now.

## 2011-01-07 ENCOUNTER — Other Ambulatory Visit: Payer: Self-pay | Admitting: Family Medicine

## 2011-01-07 DIAGNOSIS — N390 Urinary tract infection, site not specified: Secondary | ICD-10-CM

## 2011-01-08 ENCOUNTER — Other Ambulatory Visit (INDEPENDENT_AMBULATORY_CARE_PROVIDER_SITE_OTHER): Payer: Medicare Other

## 2011-01-08 DIAGNOSIS — N390 Urinary tract infection, site not specified: Secondary | ICD-10-CM

## 2011-01-08 LAB — POCT URINALYSIS DIPSTICK
Ketones, UA: NEGATIVE
Protein, UA: NEGATIVE
pH, UA: 7

## 2011-01-08 NOTE — Progress Notes (Signed)
12  

## 2011-01-12 LAB — URINE CULTURE

## 2011-01-16 ENCOUNTER — Other Ambulatory Visit: Payer: Self-pay | Admitting: Family Medicine

## 2011-01-16 DIAGNOSIS — E876 Hypokalemia: Secondary | ICD-10-CM

## 2011-01-17 ENCOUNTER — Other Ambulatory Visit (INDEPENDENT_AMBULATORY_CARE_PROVIDER_SITE_OTHER): Payer: Medicare Other

## 2011-01-17 DIAGNOSIS — E876 Hypokalemia: Secondary | ICD-10-CM

## 2011-01-17 LAB — POTASSIUM: Potassium: 4.4 mEq/L (ref 3.5–5.1)

## 2011-02-07 ENCOUNTER — Other Ambulatory Visit: Payer: Self-pay | Admitting: Family Medicine

## 2011-02-07 NOTE — Telephone Encounter (Signed)
rx sent to pharmacy by e-script  

## 2011-05-13 ENCOUNTER — Other Ambulatory Visit: Payer: Self-pay | Admitting: Family Medicine

## 2011-06-16 ENCOUNTER — Other Ambulatory Visit: Payer: Self-pay | Admitting: Family Medicine

## 2011-06-16 MED ORDER — ATORVASTATIN CALCIUM 40 MG PO TABS: 40.0000 mg | ORAL_TABLET | Freq: Every day | ORAL | Status: DC

## 2011-06-16 NOTE — Telephone Encounter (Signed)
refill Atorvastatin 40mg  tablet Qty 30 Take one tablet by mouth at bedtime Last filled 4.15.13 Last ov 11.21.12

## 2011-06-17 ENCOUNTER — Telehealth: Payer: Self-pay

## 2011-06-17 NOTE — Telephone Encounter (Signed)
Discussed with patient and I am still waiting for prior authorization approval from the insurance company. I made her aware as soon as I heard from them I would give her a call.    KP

## 2011-06-17 NOTE — Telephone Encounter (Signed)
Message left on triage voicemail: Patient would like to know the status of her getting Prolia injection. Please advise

## 2011-06-18 ENCOUNTER — Ambulatory Visit (INDEPENDENT_AMBULATORY_CARE_PROVIDER_SITE_OTHER): Payer: Medicare Other

## 2011-06-18 DIAGNOSIS — M810 Age-related osteoporosis without current pathological fracture: Secondary | ICD-10-CM

## 2011-06-18 MED ORDER — DENOSUMAB 60 MG/ML ~~LOC~~ SOLN
60.0000 mg | Freq: Once | SUBCUTANEOUS | Status: AC
  Administered 2011-06-18: 60 mg via SUBCUTANEOUS

## 2011-07-24 ENCOUNTER — Telehealth: Payer: Self-pay | Admitting: Family Medicine

## 2011-07-24 DIAGNOSIS — M810 Age-related osteoporosis without current pathological fracture: Secondary | ICD-10-CM

## 2011-07-24 NOTE — Telephone Encounter (Signed)
Order in      KP 

## 2011-07-24 NOTE — Telephone Encounter (Signed)
Pt states she needs an order for her bone density scan.

## 2011-07-28 ENCOUNTER — Other Ambulatory Visit: Payer: Self-pay | Admitting: Family Medicine

## 2011-07-30 ENCOUNTER — Other Ambulatory Visit: Payer: Self-pay | Admitting: Family Medicine

## 2011-07-30 DIAGNOSIS — Z1231 Encounter for screening mammogram for malignant neoplasm of breast: Secondary | ICD-10-CM

## 2011-08-11 ENCOUNTER — Other Ambulatory Visit: Payer: Self-pay | Admitting: Family Medicine

## 2011-08-14 ENCOUNTER — Other Ambulatory Visit: Payer: Medicare Other

## 2011-08-14 ENCOUNTER — Ambulatory Visit: Payer: Medicare Other

## 2011-08-25 ENCOUNTER — Other Ambulatory Visit: Payer: Self-pay | Admitting: Family Medicine

## 2011-08-27 ENCOUNTER — Ambulatory Visit
Admission: RE | Admit: 2011-08-27 | Discharge: 2011-08-27 | Disposition: A | Discharge status: Home / Self Care | Payer: Medicare Other | Source: Ambulatory Visit | Attending: Family Medicine | Admitting: Family Medicine

## 2011-08-27 DIAGNOSIS — Z1231 Encounter for screening mammogram for malignant neoplasm of breast: Secondary | ICD-10-CM

## 2011-08-27 DIAGNOSIS — M810 Age-related osteoporosis without current pathological fracture: Secondary | ICD-10-CM

## 2011-09-11 ENCOUNTER — Encounter: Payer: Self-pay | Admitting: Family Medicine

## 2011-09-29 ENCOUNTER — Other Ambulatory Visit: Payer: Self-pay | Admitting: Family Medicine

## 2011-10-14 ENCOUNTER — Encounter: Payer: Self-pay | Admitting: Family Medicine

## 2011-10-14 ENCOUNTER — Ambulatory Visit (INDEPENDENT_AMBULATORY_CARE_PROVIDER_SITE_OTHER): Payer: Medicare Other | Admitting: Family Medicine

## 2011-10-14 ENCOUNTER — Other Ambulatory Visit (INDEPENDENT_AMBULATORY_CARE_PROVIDER_SITE_OTHER): Payer: Medicare Other

## 2011-10-14 VITALS — BP 130/72 | HR 68 | Temp 97.9°F | Wt 122.6 lb

## 2011-10-14 DIAGNOSIS — R5383 Other fatigue: Secondary | ICD-10-CM

## 2011-10-14 DIAGNOSIS — E785 Hyperlipidemia, unspecified: Secondary | ICD-10-CM

## 2011-10-14 DIAGNOSIS — R5381 Other malaise: Secondary | ICD-10-CM

## 2011-10-14 DIAGNOSIS — K589 Irritable bowel syndrome without diarrhea: Secondary | ICD-10-CM

## 2011-10-14 DIAGNOSIS — R195 Other fecal abnormalities: Secondary | ICD-10-CM

## 2011-10-14 DIAGNOSIS — R52 Pain, unspecified: Secondary | ICD-10-CM

## 2011-10-14 LAB — BASIC METABOLIC PANEL
Chloride: 99 mEq/L (ref 96–112)
Potassium: 3.7 mEq/L (ref 3.5–5.1)

## 2011-10-14 LAB — LIPID PANEL
Cholesterol: 193 mg/dL (ref 0–200)
LDL Cholesterol: 58 mg/dL (ref 0–99)
Triglycerides: 149 mg/dL (ref 0.0–149.0)
VLDL: 29.8 mg/dL (ref 0.0–40.0)

## 2011-10-14 LAB — CBC WITH DIFFERENTIAL/PLATELET
Basophils Relative: 0.3 % (ref 0.0–3.0)
Eosinophils Relative: 0.7 % (ref 0.0–5.0)
HCT: 42.6 % (ref 36.0–46.0)
Lymphs Abs: 1.6 10*3/uL (ref 0.7–4.0)
MCV: 101.6 fl — ABNORMAL HIGH (ref 78.0–100.0)
Monocytes Absolute: 0.5 10*3/uL (ref 0.1–1.0)
Neutrophils Relative %: 70.9 % (ref 43.0–77.0)
RBC: 4.19 Mil/uL (ref 3.87–5.11)
WBC: 7.4 10*3/uL (ref 4.5–10.5)

## 2011-10-14 LAB — HEPATIC FUNCTION PANEL
Albumin: 4.6 g/dL (ref 3.5–5.2)
Total Protein: 7.5 g/dL (ref 6.0–8.3)

## 2011-10-14 NOTE — Patient Instructions (Addendum)

## 2011-10-14 NOTE — Progress Notes (Signed)
  Subjective:    Patient ID: [name], female    DOB: 1935-06-13, 76 y.o.   MRN: [id number]  HPI Pt here c/o fatigue and is very anxious about her stools.  She is still convinced she has a parasite even though O&P neg and UC check her as well and did not find anything.   She is very anxious over this and is sad about putting her dog down from cancer.  Pt states everytime she eats she has a loose BM.  No abd pain,  No NVD.      Review of Systems As above    Objective:   Physical Exam  Constitutional: She is oriented to person, place, and time. She appears well-developed and well-nourished. No distress.  Cardiovascular: Normal rate and regular rhythm.   Pulmonary/Chest: Effort normal and breath sounds normal.  Abdominal: Soft. She exhibits no distension. There is no tenderness. There is no rebound and no guarding.  Musculoskeletal: She exhibits no edema.  Neurological: She is alert and oriented to person, place, and time.  Psychiatric: Thought content normal.       Pt is teary eyed and fatigued          Assessment & Plan:   Subjective:     [name] is a 76 y.o. female who presents for evaluation of fatigue. Symptoms began about the same time she had to put her dog down.. The patient feels the fatigue began with: death of dog. Symptoms of her fatigue have been anxiousness, feelings of depression, general malaise and intermittent intestinal cramping and loose stools. Patient describes the following psychological symptoms: stress related to recent death of loved one. Patient denies change in hair texture, cold intolerance, constipation, excessive menstrual bleeding, exercise intolerance, fever, GI blood loss, significant change in weight, symptoms of arthritis, unusual rashes and witnessed or suspected sleep apnea. Symptoms have progressed to a point and plateaued. Symptom severity: struggles to carry out day to day responsibilities.. Previous visits for this problem: none.    The following portions of the patient's history were reviewed and updated as appropriate: allergies, current medications, past family history, past medical history, past social history, past surgical history and problem list.  Review of Systems Pertinent items are noted in HPI.    Objective:    BP 130/72  Pulse 68  Temp 97.9 F (36.6 C) (Oral)  Wt 122 lb 9.6 oz (55.611 kg)  SpO2 97% General appearance: alert, cooperative, appears stated age and no distress Lungs: clear to auscultation bilaterally Heart: S1, S2 normal Abdomen: soft, non-tender; bowel sounds normal; no masses,  no organomegaly Neurologic: Grossly normal Psych-- + sad, teary, talking about dog    Assessment:    fatigue ---? IBS --pt denies depression or stress reaction but I do think this plays a part  Plan:    Referral to GI because she is so worried about her stools---I tried to reassure her but she is concerned. d/w pt depression and she denies this --check labs  And pt will f/u if symptoms persist.

## 2011-10-15 ENCOUNTER — Encounter: Payer: Self-pay | Admitting: Gastroenterology

## 2011-10-20 ENCOUNTER — Telehealth: Payer: Self-pay | Admitting: Family Medicine

## 2011-10-20 NOTE — Telephone Encounter (Signed)
Spoke with pt's daughter and she wanted to make sure pt was not given anything for IBS last week.  She was unsure after talking with her mother.  Explained that nothing was in her chart, but that she was referred to GI for her GI symptoms.  Either this office or GI will call with appt and go from there.  Daughter was satisfied with response.

## 2011-10-20 NOTE — Telephone Encounter (Signed)
Pts daughter Judeth Cornfield [place], [place]) called and would like to know the name of the medication the pt is taking for her IBS. Patient is complaining of stomach issues and daughter thinks she's not taking the correct med. Call back # [phone]

## 2011-10-21 ENCOUNTER — Other Ambulatory Visit: Payer: Medicare Other

## 2011-10-21 LAB — FECAL OCCULT BLOOD, IMMUNOCHEMICAL: Fecal Occult Bld: NEGATIVE

## 2011-10-28 ENCOUNTER — Other Ambulatory Visit: Payer: Self-pay | Admitting: Family Medicine

## 2011-10-28 NOTE — Telephone Encounter (Signed)
Rx sent.    MW 

## 2011-11-07 ENCOUNTER — Other Ambulatory Visit: Payer: Self-pay | Admitting: Family Medicine

## 2011-11-20 ENCOUNTER — Ambulatory Visit: Payer: Medicare Other | Admitting: Gastroenterology

## 2011-12-22 ENCOUNTER — Encounter: Payer: Self-pay | Admitting: Gastroenterology

## 2011-12-22 ENCOUNTER — Ambulatory Visit (INDEPENDENT_AMBULATORY_CARE_PROVIDER_SITE_OTHER): Payer: Medicare Other

## 2011-12-22 ENCOUNTER — Ambulatory Visit: Payer: Medicare Other

## 2011-12-22 ENCOUNTER — Ambulatory Visit (INDEPENDENT_AMBULATORY_CARE_PROVIDER_SITE_OTHER): Payer: Medicare Other | Admitting: Gastroenterology

## 2011-12-22 VITALS — BP 150/72 | HR 80 | Ht <= 58 in | Wt 125.8 lb

## 2011-12-22 DIAGNOSIS — Z0000 Encounter for general adult medical examination without abnormal findings: Secondary | ICD-10-CM

## 2011-12-22 DIAGNOSIS — K5900 Constipation, unspecified: Secondary | ICD-10-CM | POA: Insufficient documentation

## 2011-12-22 DIAGNOSIS — K219 Gastro-esophageal reflux disease without esophagitis: Secondary | ICD-10-CM

## 2011-12-22 DIAGNOSIS — Z23 Encounter for immunization: Secondary | ICD-10-CM

## 2011-12-22 DIAGNOSIS — M810 Age-related osteoporosis without current pathological fracture: Secondary | ICD-10-CM

## 2011-12-22 MED ORDER — PANTOPRAZOLE SODIUM 40 MG PO TBEC: 40.0000 mg | DELAYED_RELEASE_TABLET | Freq: Every day | ORAL | Status: DC

## 2011-12-22 MED ORDER — DENOSUMAB 60 MG/ML ~~LOC~~ SOLN: 60.0000 mg | Freq: Once | SUBCUTANEOUS | Status: DC

## 2011-12-22 NOTE — Assessment & Plan Note (Addendum)
Patient is symptomatic as reflected by complaints of pyrosis, regurgitation of gastric contents, sore throat or hoarseness.  Recommendations #1 begin omeprazole 20 mg daily #2 upper endoscopy to rule out Barrett's esophagus #3 patient may continue Plavix

## 2011-12-22 NOTE — Progress Notes (Signed)
History of Present Illness: Pleasant 76 year old white female referred at the request of Dr. Laury Axon for evaluation of reflux. For years she's been suffering from intermittent pyrosis with regurgitation of gastric contents. She has frequent sore throat and hoarseness. In the past she has taken medicines with good response. She denies dysphagia. The patient also complains of irregular bowels. She seems to be constipated for several days and then has several loose stools on one day. She had an adenomatous polyp in 2001. 2006 colonoscopy was negative for polyps.  She also believes that she has parasites in her stools. Prior examination by her PCP apparently was negative. She denies pruritus.    Past Medical History  Diagnosis Date  . Hyperlipidemia   . Seizures   . Cerebrovascular accident 2000  . Osteoporosis   . Hypertension   . Colon polyps 2001    Tubular Adenoma  . Diverticulosis 2006  . Arthritis   . Gallstones    Past Surgical History  Procedure Date  . Cholecystectomy   . Breast lumpectomy 1975    Left  . Tonsillectomy    family history includes Diabetes in her father; Heart attack (age of onset:70) in her father; Heart disease in her brother; and Osteoporosis in an unspecified family member. Current Outpatient Prescriptions  Medication Sig Dispense Refill  . acetaminophen (TYLENOL) 500 MG tablet Take 500 mg by mouth every 6 (six) hours as needed.        Marland Kitchen aspirin 81 MG tablet Take 81 mg by mouth daily.        Marland Kitchen atorvastatin (LIPITOR) 40 MG tablet TAKE 1 TABLET BY MOUTH EVERY DAY **LABS ARE DUE NOW**  30 tablet  5  . Calcium Carbonate-Vitamin D (CALCIUM + D) 600-200 MG-UNIT TABS Take 1 tablet by mouth daily.        . carbamazepine (CARBATROL) 300 MG 12 hr capsule Take 1 capsule (300 mg total) by mouth 2 (two) times daily.  60 capsule  1  . clopidogrel (PLAVIX) 75 MG tablet Take 1 tablet (75 mg total) by mouth daily.  30 tablet  1  . denosumab (PROLIA) 60 MG/ML SOLN Inject 60 mg  into the skin every 6 (six) months.       . diphenhydrAMINE (BENADRYL) 25 MG tablet Take 25 mg by mouth every 6 (six) hours as needed.       . Multiple Vitamin (ONE-A-DAY 55 PLUS PO) Take 1 tablet by mouth daily.        . polyethylene glycol (MIRALAX / GLYCOLAX) packet Take 17 g by mouth daily as needed.        Allergies as of 12/22/2011 - Review Complete 12/22/2011  Allergen Reaction Noted  . Codeine      reports that she has never smoked. She has never used smokeless tobacco. She reports that she does not drink alcohol or use illicit drugs.     Review of Systems: Pertinent positive and negative review of systems were noted in the above HPI section. All other review of systems were otherwise negative.  Vital signs were reviewed in today's medical record Physical Exam: General: Well developed , well nourished, no acute distress Head: Normocephalic and atraumatic Eyes:  sclerae anicteric, EOMI Ears: Normal auditory acuity Mouth: No deformity or lesions Neck: Supple, no masses or thyromegaly Lungs: Clear throughout to auscultation Heart: Regular rate and rhythm; no rubs or bruits. There is a 1/6 early systolic murmur Abdomen: Soft, non tender and non distended. No masses, hepatosplenomegaly or hernias noted.  Normal Bowel sounds Rectal:deferred Musculoskeletal: Symmetrical with no gross deformities  Skin: No lesions on visible extremities Pulses:  Normal pulses noted Extremities: No clubbing, cyanosis, edema or deformities noted Neurological: Alert oriented x 4, grossly nonfocal Cervical Nodes:  No significant cervical adenopathy Inguinal Nodes: No significant inguinal adenopathy Psychological:  Alert and cooperative. Normal mood and affect

## 2011-12-22 NOTE — Patient Instructions (Addendum)
Your procedure has been scheduled Separate instructions have been given You will go to the basement today for a lab test Stay on Plavix per Dr Arlyce Dice

## 2011-12-22 NOTE — Assessment & Plan Note (Signed)
Patient likely has a functional constipation  Recommendations #1 fiber supplementation daily #2 stool O&P because of complaints that she believes she has parasites in her stool

## 2011-12-24 MED ORDER — DENOSUMAB 60 MG/ML ~~LOC~~ SOLN
60.0000 mg | Freq: Once | SUBCUTANEOUS | Status: AC
  Administered 2011-12-24: 60 mg via SUBCUTANEOUS

## 2011-12-25 ENCOUNTER — Encounter: Payer: Medicare Other | Admitting: Gastroenterology

## 2012-01-06 ENCOUNTER — Telehealth: Payer: Self-pay | Admitting: Gastroenterology

## 2012-01-06 NOTE — Telephone Encounter (Signed)
Discussed with pt that per the OV note from 12/22/11 with Dr. Arlyce Dice pt was to continue her Plavix. Pt aware.

## 2012-01-09 ENCOUNTER — Encounter: Payer: Self-pay | Admitting: Gastroenterology

## 2012-01-09 ENCOUNTER — Ambulatory Visit (AMBULATORY_SURGERY_CENTER): Payer: Medicare Other | Admitting: Gastroenterology

## 2012-01-09 ENCOUNTER — Telehealth: Payer: Self-pay | Admitting: Internal Medicine

## 2012-01-09 VITALS — BP 146/79 | HR 92 | Temp 97.9°F | Resp 21 | Ht <= 58 in | Wt 125.0 lb

## 2012-01-09 DIAGNOSIS — K5900 Constipation, unspecified: Secondary | ICD-10-CM

## 2012-01-09 DIAGNOSIS — K219 Gastro-esophageal reflux disease without esophagitis: Secondary | ICD-10-CM

## 2012-01-09 MED ORDER — SODIUM CHLORIDE 0.9 % IV SOLN: 500.0000 mL | INTRAVENOUS | Status: DC

## 2012-01-09 NOTE — Progress Notes (Signed)
Patient did not experience any of the following events: a burn prior to discharge; a fall within the facility; wrong site/side/patient/procedure/implant event; or a hospital transfer or hospital admission upon discharge from the facility. (G8907) Patient did not have preoperative order for IV antibiotic SSI prophylaxis. (G8918)  

## 2012-01-09 NOTE — Op Note (Signed)
[facility] [address] [place], [address]   ENDOSCOPY PROCEDURE REPORT  PATIENT: Tracie, Medina  MR#: [id number] BIRTHDATE: 1935/08/05 , 76  yrs. old GENDER: Female ENDOSCOPIST: Louis Meckel, MD REFERRED BY:  Loreen Freud, DO PROCEDURE DATE:  01/09/2012 PROCEDURE:  EGD, diagnostic ASA CLASS:     Class II INDICATIONS:  Heartburn. MEDICATIONS: MAC sedation, administered by CRNA, propofol (Diprivan) 100mg  IV, Robinul 0.2 mg IV, and Simethicone 0.6cc PO TOPICAL ANESTHETIC: Cetacaine Spray  DESCRIPTION OF PROCEDURE: After the risks benefits and alternatives of the procedure were thoroughly explained, informed consent was obtained.  The [facility] [id number] endoscope was introduced through the mouth and advanced to the third portion of the duodenum. Without limitations.  The instrument was slowly withdrawn as the mucosa was fully examined.      The upper, middle and distal third of the esophagus were carefully inspected and no abnormalities were noted.  The z-line was well seen at the GEJ.  The endoscope was pushed into the fundus which was normal including a retroflexed view.  The antrum, gastric body, first and second part of the duodenum were unremarkable. Retroflexed views revealed no abnormalities.     The scope was then withdrawn from the patient and the procedure completed.  COMPLICATIONS: There were no complications. ENDOSCOPIC IMPRESSION: Normal EGD  RECOMMENDATIONS: 1.  Continue PPI 2.   Office visit 4-6 weeks  REPEAT EXAM:  eSigned:  Louis Meckel, MD 01/09/2012 11:01 AM   CC:

## 2012-01-09 NOTE — Progress Notes (Signed)
1111 on left side of neck where o2 canula is rubbing there is a small abrasion that is weeping blood.  Pt and caregiver both states she bleeds and bruises very easily.  Pressure applied to site and bleeding stopped. Very small bruised, abrasion to skin.

## 2012-01-09 NOTE — Patient Instructions (Addendum)
Findings:  Normal EGD Recommendations:  Continue Pantoprazole(Protonix), Office visit with Dr Arlyce Dice in 4-6 Weeks  YOU HAD AN ENDOSCOPIC PROCEDURE TODAY AT THE [facility]: Refer to the procedure report that was given to you for any specific questions about what was found during the examination.  If the procedure report does not answer your questions, please call your gastroenterologist to clarify.  If you requested that your care partner not be given the details of your procedure findings, then the procedure report has been included in a sealed envelope for you to review at your convenience later.  YOU SHOULD EXPECT: Some feelings of bloating in the abdomen. Passage of more gas than usual.  Walking can help get rid of the air that was put into your GI tract during the procedure and reduce the bloating. If you had a lower endoscopy (such as a colonoscopy or flexible sigmoidoscopy) you may notice spotting of blood in your stool or on the toilet paper. If you underwent a bowel prep for your procedure, then you may not have a normal bowel movement for a few days.  DIET: Your first meal following the procedure should be a light meal and then it is ok to progress to your normal diet.  A half-sandwich or bowl of soup is an example of a good first meal.  Heavy or fried foods are harder to digest and may make you feel nauseous or bloated.  Likewise meals heavy in dairy and vegetables can cause extra gas to form and this can also increase the bloating.  Drink plenty of fluids but you should avoid alcoholic beverages for 24 hours.  ACTIVITY: Your care partner should take you home directly after the procedure.  You should plan to take it easy, moving slowly for the rest of the day.  You can resume normal activity the day after the procedure however you should NOT DRIVE or use heavy machinery for 24 hours (because of the sedation medicines used during the test).    SYMPTOMS TO REPORT IMMEDIATELY: A  gastroenterologist can be reached at any hour.  During normal business hours, 8:30 AM to 5:00 PM Monday through Friday, call [phone].  After hours and on weekends, please call the GI answering service at [phone] who will take a message and have the physician on call contact you.   Following lower endoscopy (colonoscopy or flexible sigmoidoscopy):  Excessive amounts of blood in the stool  Significant tenderness or worsening of abdominal pains  Swelling of the abdomen that is new, acute  Fever of 100F or higher  Following upper endoscopy (EGD)  Vomiting of blood or coffee ground material  New chest pain or pain under the shoulder blades  Painful or persistently difficult swallowing  New shortness of breath  Fever of 100F or higher  Black, tarry-looking stools  FOLLOW UP: If any biopsies were taken you will be contacted by phone or by letter within the next 1-3 weeks.  Call your gastroenterologist if you have not heard about the biopsies in 3 weeks.  Our staff will call the home number listed on your records the next business day following your procedure to check on you and address any questions or concerns that you may have at that time regarding the information given to you following your procedure. This is a courtesy call and so if there is no answer at the home number and we have not heard from you through the emergency physician on call, we will assume  that you have returned to your regular daily activities without incident.  SIGNATURES/CONFIDENTIALITY: You and/or your care partner have signed paperwork which will be entered into your electronic medical record.  These signatures attest to the fact that that the information above on your After Visit Summary has been reviewed and is understood.  Full responsibility of the confidentiality of this discharge information lies with you and/or your care-partner.   Please follow all discharge instructions given to you by the recovery  room nurse. If you have any questions or problems after discharge please call one of the numbers listed above. You will receive a phone call in the am to see how you are doing and answer any questions you may have. Thank you for choosing [facility] for your health care needs.

## 2012-01-09 NOTE — Telephone Encounter (Signed)
Had normal EGD today. Having bilateral lower chest wall pain with bending and lifting. Tolerating diet but not hungry. No fever, nausea, vomiting, or dyspnea.  Advised heating pad, acetaminophen and avoid bending and twisting as much as she can.  Call back prn. If severe problems go to ED advised also.

## 2012-01-12 ENCOUNTER — Telehealth: Payer: Self-pay | Admitting: *Deleted

## 2012-01-12 NOTE — Telephone Encounter (Signed)
  Follow up Call-  Call back number 01/09/2012  Post procedure Call Back phone  # [phone]  Permission to leave phone message Yes     Patient questions:  Do you have a fever, pain , or abdominal swelling? no Pain Score  0 *  Have you tolerated food without any problems? yes  Have you been able to return to your normal activities? yes  Do you have any questions about your discharge instructions: Diet   no Medications  no Follow up visit  no  Do you have questions or concerns about your Care? no  Actions: * If pain score is 4 or above: No action needed, pain <4. Pt states she has bruising on her neck and her jaw area after she got home on Friday and also has a cut on her face that she didn't have prior to the procedure. Pt states is like a hemorrhage to the skin, like petechiae , is resolving but questioned cause. Pt states she is on plavix and does bruise very easily. Instructed pt that  the gagging can cause  Increased pressure to the face and cause this but there are no notes about the cause of this noted. Pt states she will discuss with Dr Arlyce Dice st her follow up visit in 6 weeks. Pt states she isn't upset but just has questions about the cause. ewm

## 2012-02-08 ENCOUNTER — Other Ambulatory Visit: Payer: Self-pay | Admitting: Family Medicine

## 2012-02-12 ENCOUNTER — Ambulatory Visit: Payer: Medicare Other | Admitting: Gastroenterology

## 2012-03-15 ENCOUNTER — Encounter: Payer: Self-pay | Admitting: Gastroenterology

## 2012-03-15 ENCOUNTER — Ambulatory Visit (INDEPENDENT_AMBULATORY_CARE_PROVIDER_SITE_OTHER): Payer: 59 | Admitting: Gastroenterology

## 2012-03-15 VITALS — BP 148/86 | HR 72 | Ht <= 58 in | Wt 127.8 lb

## 2012-03-15 DIAGNOSIS — K219 Gastro-esophageal reflux disease without esophagitis: Secondary | ICD-10-CM

## 2012-03-15 DIAGNOSIS — K5900 Constipation, unspecified: Secondary | ICD-10-CM

## 2012-03-15 NOTE — Progress Notes (Signed)
History of Present Illness:  The patient has returned for followup of GERD. On protonix daily dyspepsia and pyrosis have entirely subsided.  Endoscopy in December, 2013 was normal. Her main complaint is constipation which is rather mild.    Review of Systems: Pertinent positive and negative review of systems were noted in the above HPI section. All other review of systems were otherwise negative.    Current Medications, Allergies, Past Medical History, Past Surgical History, Family History and Social History were reviewed in [facility] electronic medical record  Vital signs were reviewed in today's medical record. Physical Exam: General: Well developed , well nourished, no acute distress

## 2012-03-15 NOTE — Assessment & Plan Note (Signed)
Plan fiber supplementation 

## 2012-03-15 NOTE — Assessment & Plan Note (Signed)
Symptoms are well controlled with Protonix.  Plan to continue with the same. 

## 2012-03-15 NOTE — Patient Instructions (Addendum)
Follow up as needed

## 2012-04-23 ENCOUNTER — Other Ambulatory Visit: Payer: Self-pay | Admitting: Family Medicine

## 2012-05-07 ENCOUNTER — Encounter: Payer: Self-pay | Admitting: Family Medicine

## 2012-05-07 ENCOUNTER — Ambulatory Visit (INDEPENDENT_AMBULATORY_CARE_PROVIDER_SITE_OTHER): Payer: 59 | Admitting: Family Medicine

## 2012-05-07 VITALS — BP 122/80 | HR 84 | Temp 98.7°F | Wt 128.0 lb

## 2012-05-07 DIAGNOSIS — J019 Acute sinusitis, unspecified: Secondary | ICD-10-CM

## 2012-05-07 DIAGNOSIS — R350 Frequency of micturition: Secondary | ICD-10-CM

## 2012-05-07 DIAGNOSIS — N390 Urinary tract infection, site not specified: Secondary | ICD-10-CM

## 2012-05-07 LAB — POCT URINALYSIS DIPSTICK: Ketones, UA: NEGATIVE

## 2012-05-07 MED ORDER — LEVOFLOXACIN 500 MG PO TABS: 500.0000 mg | ORAL_TABLET | Freq: Every day | ORAL | Status: DC

## 2012-05-07 NOTE — Patient Instructions (Signed)
Urinary Tract Infection Urinary tract infections (UTIs) can develop anywhere along your urinary tract. Your urinary tract is your body's drainage system for removing wastes and extra water. Your urinary tract includes two kidneys, two ureters, a bladder, and a urethra. Your kidneys are a pair of bean-shaped organs. Each kidney is about the size of your fist. They are located below your ribs, one on each side of your spine. CAUSES Infections are caused by microbes, which are microscopic organisms, including fungi, viruses, and bacteria. These organisms are so small that they can only be seen through a microscope. Bacteria are the microbes that most commonly cause UTIs. SYMPTOMS  Symptoms of UTIs may vary by age and gender of the patient and by the location of the infection. Symptoms in young women typically include a frequent and intense urge to urinate and a painful, burning feeling in the bladder or urethra during urination. Older women and men are more likely to be tired, shaky, and weak and have muscle aches and abdominal pain. A fever may mean the infection is in your kidneys. Other symptoms of a kidney infection include pain in your back or sides below the ribs, nausea, and vomiting. DIAGNOSIS To diagnose a UTI, your caregiver will ask you about your symptoms. Your caregiver also will ask to provide a urine sample. The urine sample will be tested for bacteria and white blood cells. White blood cells are made by your body to help fight infection. TREATMENT  Typically, UTIs can be treated with medication. Because most UTIs are caused by a bacterial infection, they usually can be treated with the use of antibiotics. The choice of antibiotic and length of treatment depend on your symptoms and the type of bacteria causing your infection. HOME CARE INSTRUCTIONS  If you were prescribed antibiotics, take them exactly as your caregiver instructs you. Finish the medication even if you feel better after you  have only taken some of the medication.  Drink enough water and fluids to keep your urine clear or pale yellow.  Avoid caffeine, tea, and carbonated beverages. They tend to irritate your bladder.  Empty your bladder often. Avoid holding urine for long periods of time.  Empty your bladder before and after sexual intercourse.  After a bowel movement, women should cleanse from front to back. Use each tissue only once. SEEK MEDICAL CARE IF:   You have back pain.  You develop a fever.  Your symptoms do not begin to resolve within 3 days. SEEK IMMEDIATE MEDICAL CARE IF:   You have severe back pain or lower abdominal pain.  You develop chills.  You have nausea or vomiting.  You have continued burning or discomfort with urination. MAKE SURE YOU:   Understand these instructions.  Will watch your condition.  Will get help right away if you are not doing well or get worse. Document Released: 10/30/2004 Document Revised: 07/22/2011 Document Reviewed: 02/28/2011 [employer] Patient Information 2013 [employer].  

## 2012-05-07 NOTE — Progress Notes (Signed)
  Subjective:    [name] is a 77 y.o. female who complains of frequency, incomplete bladder emptying, pain in the RUQ and urgency. She has had symptoms for 4 weeks. Patient also complains of nothing else. Patient denies back pain, congestion, cough, fever, headache, rhinitis, sorethroat, stomach ache and vaginal discharge. Patient does not have a history of recurrent UTI. Patient does not have a history of pyelonephritis.  Pt also c/o puffiness in R max sinus and congestion for several weeks.   The following portions of the patient's history were reviewed and updated as appropriate: allergies, current medications, past family history, past medical history, past social history, past surgical history and problem list.  Review of Systems Pertinent items are noted in HPI.    Objective:    BP 122/80  Pulse 84  Temp(Src) 98.7 F (37.1 C) (Oral)  Wt 128 lb (58.06 kg)  BMI 26.76 kg/m2  SpO2 97% General appearance: alert, cooperative, appears stated age and no distress Ears: normal TM's and external ear canals both ears Nose: yellow discharge, mild congestion, turbinates red, swollen, sinus tenderness right Throat: lips, mucosa, and tongue normal; teeth and gums normal Neck: no adenopathy, supple, symmetrical, trachea midline and thyroid not enlarged, symmetric, no tenderness/mass/nodules Lungs: clear to auscultation bilaterally Heart: S1, S2 normal Abdomen: soft, non-tender; bowel sounds normal; no masses,  no organomegaly  Laboratory:  Urine dipstick: trace for glucose, 1+ for leukocyte esterase, negative for nitrites, small for protein and trace for urobilinogen.   Micro exam: not done.    Assessment:    dysuria    sinusitis-- levaquin should help this as well, nasonex sample given Plan:    Medications: levaquin. Maintain adequate hydration. Follow up if symptoms not improving, and as needed.

## 2012-05-09 ENCOUNTER — Other Ambulatory Visit: Payer: Self-pay | Admitting: Family Medicine

## 2012-05-10 LAB — URINE CULTURE: Colony Count: 40000

## 2012-05-24 ENCOUNTER — Other Ambulatory Visit: Payer: Self-pay | Admitting: Family Medicine

## 2012-06-18 ENCOUNTER — Other Ambulatory Visit: Payer: 59

## 2012-06-22 ENCOUNTER — Encounter: Payer: Self-pay | Admitting: Family Medicine

## 2012-06-22 ENCOUNTER — Ambulatory Visit (INDEPENDENT_AMBULATORY_CARE_PROVIDER_SITE_OTHER): Payer: 59 | Admitting: Family Medicine

## 2012-06-22 VITALS — BP 132/84 | HR 83 | Temp 98.7°F | Wt 123.8 lb

## 2012-06-22 DIAGNOSIS — D239 Other benign neoplasm of skin, unspecified: Secondary | ICD-10-CM

## 2012-06-22 DIAGNOSIS — R7309 Other abnormal glucose: Secondary | ICD-10-CM

## 2012-06-22 DIAGNOSIS — M549 Dorsalgia, unspecified: Secondary | ICD-10-CM

## 2012-06-22 DIAGNOSIS — I781 Nevus, non-neoplastic: Secondary | ICD-10-CM

## 2012-06-22 DIAGNOSIS — R109 Unspecified abdominal pain: Secondary | ICD-10-CM

## 2012-06-22 DIAGNOSIS — I10 Essential (primary) hypertension: Secondary | ICD-10-CM

## 2012-06-22 DIAGNOSIS — D229 Melanocytic nevi, unspecified: Secondary | ICD-10-CM

## 2012-06-22 DIAGNOSIS — R300 Dysuria: Secondary | ICD-10-CM

## 2012-06-22 DIAGNOSIS — R739 Hyperglycemia, unspecified: Secondary | ICD-10-CM

## 2012-06-22 DIAGNOSIS — K219 Gastro-esophageal reflux disease without esophagitis: Secondary | ICD-10-CM

## 2012-06-22 DIAGNOSIS — E785 Hyperlipidemia, unspecified: Secondary | ICD-10-CM

## 2012-06-22 LAB — POCT URINALYSIS DIPSTICK
Glucose, UA: NEGATIVE
Leukocytes, UA: NEGATIVE
Nitrite, UA: NEGATIVE
Protein, UA: NEGATIVE
Urobilinogen, UA: 0.2

## 2012-06-22 LAB — BASIC METABOLIC PANEL
Calcium: 9.6 mg/dL (ref 8.4–10.5)
GFR: 65.7 mL/min (ref >60.00)
Potassium: 4.1 mEq/L (ref 3.5–5.1)
Sodium: 139 mEq/L (ref 135–145)

## 2012-06-22 LAB — CBC WITH DIFFERENTIAL/PLATELET
Basophils Absolute: 0 10*3/uL (ref 0.0–0.1)
Basophils Relative: 0.3 % (ref 0.0–3.0)
Eosinophils Relative: 0.5 % (ref 0.0–5.0)
Hemoglobin: 14.6 g/dL (ref 12.0–15.0)
Lymphocytes Relative: 20.2 % (ref 12.0–46.0)
Monocytes Relative: 6.1 % (ref 3.0–12.0)
Neutro Abs: 5.6 10*3/uL (ref 1.4–7.7)
RBC: 4.26 Mil/uL (ref 3.87–5.11)
RDW: 13.8 % (ref 11.5–14.6)
WBC: 7.7 10*3/uL (ref 4.5–10.5)

## 2012-06-22 LAB — LIPID PANEL
HDL: 92.2 mg/dL (ref >39.00)
Total CHOL/HDL Ratio: 2
VLDL: 22.6 mg/dL (ref 0.0–40.0)

## 2012-06-22 LAB — HEPATIC FUNCTION PANEL
AST: 23 U/L (ref 0–37)
Alkaline Phosphatase: 80 U/L (ref 39–117)
Bilirubin, Direct: 0 mg/dL (ref 0.0–0.3)
Total Protein: 7.8 g/dL (ref 6.0–8.3)

## 2012-06-22 MED ORDER — PANTOPRAZOLE SODIUM 40 MG PO TBEC: 40.0000 mg | DELAYED_RELEASE_TABLET | Freq: Every day | ORAL | Status: DC

## 2012-06-22 MED ORDER — GI COCKTAIL ~~LOC~~
30.0000 mL | Freq: Once | ORAL | Status: AC
  Administered 2012-06-22: 30 mL via ORAL

## 2012-06-22 NOTE — Assessment & Plan Note (Signed)
Check labs con't meds 

## 2012-06-22 NOTE — Patient Instructions (Addendum)
Back Pain, Adult Low back pain is very common. About 1 in 5 people have back pain.The cause of low back pain is rarely dangerous. The pain often gets better over time.About half of people with a sudden onset of back pain feel better in just 2 weeks. About 8 in 10 people feel better by 6 weeks.  CAUSES Some common causes of back pain include:  Strain of the muscles or ligaments supporting the spine.  Wear and tear (degeneration) of the spinal discs.  Arthritis.  Direct injury to the back. DIAGNOSIS Most of the time, the direct cause of low back pain is not known.However, back pain can be treated effectively even when the exact cause of the pain is unknown.Answering your caregiver's questions about your overall health and symptoms is one of the most accurate ways to make sure the cause of your pain is not dangerous. If your caregiver needs more information, he or she may order lab work or imaging tests (X-rays or MRIs).However, even if imaging tests show changes in your back, this usually does not require surgery. HOME CARE INSTRUCTIONS For many people, back pain returns.Since low back pain is rarely dangerous, it is often a condition that people can learn to manageon their own.   Remain active. It is stressful on the back to sit or stand in one place. Do not sit, drive, or stand in one place for more than 30 minutes at a time. Take short walks on level surfaces as soon as pain allows.Try to increase the length of time you walk each day.  Do not stay in bed.Resting more than 1 or 2 days can delay your recovery.  Do not avoid exercise or work.Your body is made to move.It is not dangerous to be active, even though your back may hurt.Your back will likely heal faster if you return to being active before your pain is gone.  Pay attention to your body when you bend and lift. Many people have less discomfortwhen lifting if they bend their knees, keep the load close to their bodies,and  avoid twisting. Often, the most comfortable positions are those that put less stress on your recovering back.  Find a comfortable position to sleep. Use a firm mattress and lie on your side with your knees slightly bent. If you lie on your back, put a pillow under your knees.  Only take over-the-counter or prescription medicines as directed by your caregiver. Over-the-counter medicines to reduce pain and inflammation are often the most helpful.Your caregiver may prescribe muscle relaxant drugs.These medicines help dull your pain so you can more quickly return to your normal activities and healthy exercise.  Put ice on the injured area.  Put ice in a plastic bag.  Place a towel between your skin and the bag.  Leave the ice on for 15 to 20 minutes, 3 to 4 times a day for the first 2 to 3 days. After that, ice and heat may be alternated to reduce pain and spasms.  Ask your caregiver about trying back exercises and gentle massage. This may be of some benefit.  Avoid feeling anxious or stressed.Stress increases muscle tension and can worsen back pain.It is important to recognize when you are anxious or stressed and learn ways to manage it.Exercise is a great option. SEEK MEDICAL CARE IF:  You have pain that is not relieved with rest or medicine.  You have pain that does not improve in 1 week.  You have new symptoms.  You are generally   not feeling well. SEEK IMMEDIATE MEDICAL CARE IF:   You have pain that radiates from your back into your legs.  You develop new bowel or bladder control problems.  You have unusual weakness or numbness in your arms or legs.  You develop nausea or vomiting.  You develop abdominal pain.  You feel faint. Document Released: 01/20/2005 Document Revised: 07/22/2011 Document Reviewed: 06/10/2010 [facility] Patient Information 2013 [place], [place].   Diet for Gastroesophageal Reflux Disease, Adult Reflux (acid reflux) is when acid from your stomach  flows up into the esophagus. When acid comes in contact with the esophagus, the acid causes irritation and soreness (inflammation) in the esophagus. When reflux happens often or so severely that it causes damage to the esophagus, it is called gastroesophageal reflux disease (GERD). Nutrition therapy can help ease the discomfort of GERD. FOODS OR DRINKS TO AVOID OR LIMIT  Smoking or chewing tobacco. Nicotine is one of the most potent stimulants to acid production in the gastrointestinal tract.  Caffeinated and decaffeinated coffee and black tea.  Regular or low-calorie carbonated beverages or energy drinks (caffeine-free carbonated beverages are allowed).   Strong spices, such as black pepper, white pepper, red pepper, cayenne, curry powder, and chili powder.  Peppermint or spearmint.  Chocolate.  High-fat foods, including meats and fried foods. Extra added fats including oils, butter, salad dressings, and nuts. Limit these to less than 8 tsp per day.  Fruits and vegetables if they are not tolerated, such as citrus fruits or tomatoes.  Alcohol.  Any food that seems to aggravate your condition. If you have questions regarding your diet, call your caregiver or a registered dietitian. OTHER THINGS THAT MAY HELP GERD INCLUDE:   Eating your meals slowly, in a relaxed setting.  Eating 5 to 6 small meals per day instead of 3 large meals.  Eliminating food for a period of time if it causes distress.  Not lying down until 3 hours after eating a meal.  Keeping the head of your bed raised 6 to 9 inches (15 to 23 cm) by using a foam wedge or blocks under the legs of the bed. Lying flat may make symptoms worse.  Being physically active. Weight loss may be helpful in reducing reflux in overweight or obese adults.  Wear loose fitting clothing EXAMPLE MEAL PLAN This meal plan is approximately 2,000 calories based on https://www.bernard.org/ meal planning guidelines. Breakfast   cup cooked  oatmeal.  1 cup strawberries.  1 cup low-fat milk.  1 oz almonds. Snack  1 cup cucumber slices.  6 oz yogurt (made from low-fat or fat-free milk). Lunch  2 slice whole-wheat bread.  2 oz sliced [place].  2 tsp mayonnaise.  1 cup blueberries.  1 cup snap peas. Snack  6 whole-wheat crackers.  1 oz string cheese. Dinner   cup brown rice.  1 cup mixed veggies.  1 tsp olive oil.  3 oz grilled fish. Document Released: 01/20/2005 Document Revised: 04/14/2011 Document Reviewed: 12/06/2010 [facility] Patient Information 2013 [place], [place].

## 2012-06-22 NOTE — Assessment & Plan Note (Signed)
Worsening pain Referral to [facility] protonix bid

## 2012-06-22 NOTE — Progress Notes (Signed)
  Subjective:    [name] is a 77 y.o. female who presents for evaluation of low back pain. The patient has had no prior back problems. Symptoms have been present for 6 days and are gradually worsening.  Onset was related to / precipitated by no known injury. The pain is located in the right lumbar area and radiates to the right thigh. The pain is described as sharp and occurs all day. She rates her pain as a 10 on a scale of 0-10. Symptoms are exacerbated by sitting, standing and walking. Symptoms are improved by heat and laying down. She has also tried nothing which provided no symptom relief. She has burning pain in the right leg and urinary incontinence associated with the back pain. The patient has no "red flag" history indicative of complicated back pain.  Pt also c/o abd pain-- mid epigastric and RUQ tenderness.  Pt taking protonix.  No NVD.  She is also here to f/u htn, hyperlipidemia--- no problems with meds.  Labs need to be drawn.  The following portions of the patient's history were reviewed and updated as appropriate: allergies, current medications, past family history, past medical history, past social history, past surgical history and problem list.  Review of Systems Pertinent items are noted in HPI.    Objective:           Inspection and palpation: inspection of back is normal. Muscle tone and ROM exam: limited range of motion with pain, antalgic gait. Straight leg raise: positive at 45 degrees bilaterally. Neurological: normal DTRs, muscle strength and reflexes.   Filed Vitals:   06/22/12 1027  BP: 132/84  Pulse: 83  Temp: 98.7 F (37.1 C)  TempSrc: Oral  Weight: 123 lb 12.8 oz (56.155 kg)  SpO2: 99%   General appearance: alert, cooperative, appears stated age and moderate distress Neck: no adenopathy, no carotid bruit, no JVD, supple, symmetrical, trachea midline and thyroid not enlarged, symmetric, no tenderness/mass/nodules Lungs: clear to auscultation  bilaterally Heart: S1, S2 normal Abdomen: abnormal findings:  moderate tenderness in the epigastrium and in the RUQ GI cocktail given in office with relief of midepigastric pain.   Assessment:    Nonspecific acute low back pain    Plan:    Natural history and expected course discussed. Questions answered. Stretching exercises discussed. Regular aerobic and trunk strengthening exercises discussed. Short (2-4 day) period of relative rest recommended until acute symptoms improve. Ice to affected area as needed for local pain relief. Heat to affected area as needed for local pain relief. Muscle relaxants per medication orders. Follow-up in 2 weeks. pt requesting ortho referral

## 2012-06-22 NOTE — Assessment & Plan Note (Signed)
Stable con't meds 

## 2012-06-23 ENCOUNTER — Other Ambulatory Visit: Payer: Self-pay | Admitting: Family Medicine

## 2012-06-23 ENCOUNTER — Telehealth: Payer: Self-pay | Admitting: Family Medicine

## 2012-06-23 DIAGNOSIS — K219 Gastro-esophageal reflux disease without esophagitis: Secondary | ICD-10-CM

## 2012-06-23 LAB — MICROALBUMIN / CREATININE URINE RATIO: Microalb, Ur: 3.8 mg/dL — ABNORMAL HIGH (ref 0.0–1.9)

## 2012-06-23 MED ORDER — PANTOPRAZOLE SODIUM 40 MG PO TBEC: 40.0000 mg | DELAYED_RELEASE_TABLET | Freq: Two times a day (BID) | ORAL | Status: DC

## 2012-06-23 NOTE — Telephone Encounter (Signed)
Looks like this was corrected today.

## 2012-06-23 NOTE — Telephone Encounter (Signed)
Patient states she needs rx for pantoprazole. We increased her dosage, but called in #30 yesterday. She states she needs #60. Pt uses [facility] on [address].

## 2012-06-23 NOTE — Telephone Encounter (Signed)
Call from patient stating the Protonix was inc to BID but sent for once a day with a quantity of 30, she would like for [place] to correct the directions and send with the correct quantity.      KP

## 2012-07-02 ENCOUNTER — Telehealth: Payer: Self-pay

## 2012-07-02 NOTE — Telephone Encounter (Signed)
Apt scheduled for Monday 07/05/12     KP

## 2012-07-02 NOTE — Telephone Encounter (Signed)
Prolia benefits verified. No PA needed. co-pay for the Prolia is $40 dollars which will cover Admin charge & OV, and $50 co-pay which will cover Prolia. Co-pays contribute to a $4000 out of pocket max ($76.93) has been met. I left the patient a message to call back to schedule. He injection was due on 06/20/12 medication is here in the office     KP

## 2012-07-05 ENCOUNTER — Ambulatory Visit (INDEPENDENT_AMBULATORY_CARE_PROVIDER_SITE_OTHER): Payer: 59 | Admitting: General Practice

## 2012-07-05 DIAGNOSIS — M810 Age-related osteoporosis without current pathological fracture: Secondary | ICD-10-CM

## 2012-07-05 MED ORDER — DENOSUMAB 60 MG/ML ~~LOC~~ SOLN
60.0000 mg | Freq: Once | SUBCUTANEOUS | Status: AC
  Administered 2012-07-05: 60 mg via SUBCUTANEOUS

## 2012-07-12 NOTE — Addendum Note (Signed)
Addended by: Silvio Pate D on: 07/12/2012 05:06 PM   Modules accepted: Orders

## 2012-07-13 LAB — PROTEIN, URINE, 24 HOUR

## 2012-07-19 ENCOUNTER — Ambulatory Visit: Payer: 59 | Admitting: Gastroenterology

## 2012-08-17 ENCOUNTER — Other Ambulatory Visit: Payer: Self-pay

## 2012-08-17 DIAGNOSIS — Z1231 Encounter for screening mammogram for malignant neoplasm of breast: Secondary | ICD-10-CM

## 2012-09-07 ENCOUNTER — Ambulatory Visit
Admission: RE | Admit: 2012-09-07 | Discharge: 2012-09-07 | Disposition: A | Discharge status: Home / Self Care | Payer: Medicare Other | Source: Ambulatory Visit

## 2012-09-07 ENCOUNTER — Ambulatory Visit: Payer: 59

## 2012-09-07 DIAGNOSIS — Z1231 Encounter for screening mammogram for malignant neoplasm of breast: Secondary | ICD-10-CM

## 2012-10-06 ENCOUNTER — Other Ambulatory Visit: Payer: Self-pay | Admitting: *Deleted

## 2012-10-06 DIAGNOSIS — K219 Gastro-esophageal reflux disease without esophagitis: Secondary | ICD-10-CM

## 2012-10-06 MED ORDER — PANTOPRAZOLE SODIUM 40 MG PO TBEC: 40.0000 mg | DELAYED_RELEASE_TABLET | Freq: Two times a day (BID) | ORAL | Status: DC

## 2012-10-06 NOTE — Telephone Encounter (Signed)
Rx was refilled for Pantoprazole 40 mg.  Ag cma

## 2012-11-04 ENCOUNTER — Other Ambulatory Visit: Payer: Self-pay | Admitting: Family Medicine

## 2012-11-30 ENCOUNTER — Ambulatory Visit (INDEPENDENT_AMBULATORY_CARE_PROVIDER_SITE_OTHER): Payer: Medicare Other | Admitting: General Practice

## 2012-11-30 DIAGNOSIS — Z23 Encounter for immunization: Secondary | ICD-10-CM

## 2012-12-20 ENCOUNTER — Other Ambulatory Visit: Payer: Self-pay

## 2012-12-20 MED ORDER — ATORVASTATIN CALCIUM 40 MG PO TABS: 40.0000 mg | ORAL_TABLET | Freq: Every day | ORAL | Status: DC

## 2013-01-04 ENCOUNTER — Other Ambulatory Visit: Payer: Self-pay | Admitting: Family Medicine

## 2013-01-24 ENCOUNTER — Other Ambulatory Visit: Payer: Self-pay | Admitting: Family Medicine

## 2013-01-24 NOTE — Telephone Encounter (Signed)
Patient is calling to request a refill on her atorvastatin (LIPITOR) 40 MG rx. She is completely out.

## 2013-01-31 ENCOUNTER — Other Ambulatory Visit: Payer: Self-pay | Admitting: Family Medicine

## 2013-02-04 ENCOUNTER — Ambulatory Visit: Payer: Medicare Other | Admitting: Family Medicine

## 2013-02-24 ENCOUNTER — Other Ambulatory Visit: Payer: Self-pay | Admitting: Family Medicine

## 2013-03-01 ENCOUNTER — Ambulatory Visit (INDEPENDENT_AMBULATORY_CARE_PROVIDER_SITE_OTHER): Payer: 59 | Admitting: Family Medicine

## 2013-03-01 ENCOUNTER — Encounter: Payer: Self-pay | Admitting: Family Medicine

## 2013-03-01 VITALS — BP 124/78 | HR 60 | Temp 97.9°F | Wt 129.0 lb

## 2013-03-01 DIAGNOSIS — I10 Essential (primary) hypertension: Secondary | ICD-10-CM

## 2013-03-01 DIAGNOSIS — R350 Frequency of micturition: Secondary | ICD-10-CM

## 2013-03-01 DIAGNOSIS — E785 Hyperlipidemia, unspecified: Secondary | ICD-10-CM

## 2013-03-01 LAB — POCT URINALYSIS DIPSTICK
Bilirubin, UA: NEGATIVE
Blood, UA: NEGATIVE
Glucose, UA: NEGATIVE
Ketones, UA: NEGATIVE
Leukocytes, UA: NEGATIVE
NITRITE UA: NEGATIVE
Urobilinogen, UA: 0.2
pH, UA: 5

## 2013-03-01 LAB — HEPATIC FUNCTION PANEL
ALK PHOS: 78 U/L (ref 39–117)
ALT: 32 U/L (ref 0–35)
AST: 24 U/L (ref 0–37)
Albumin: 4.2 g/dL (ref 3.5–5.2)
BILIRUBIN DIRECT: 0 mg/dL (ref 0.0–0.3)
BILIRUBIN TOTAL: 0.7 mg/dL (ref 0.3–1.2)
Total Protein: 7.2 g/dL (ref 6.0–8.3)

## 2013-03-01 LAB — BASIC METABOLIC PANEL
BUN: 19 mg/dL (ref 6–23)
CHLORIDE: 102 meq/L (ref 96–112)
CO2: 31 mEq/L (ref 19–32)
Calcium: 9.8 mg/dL (ref 8.4–10.5)
Creatinine, Ser: 0.9 mg/dL (ref 0.4–1.2)
GFR: 63.92 mL/min (ref >60.00)
Glucose, Bld: 124 mg/dL — ABNORMAL HIGH (ref 70–99)
POTASSIUM: 4 meq/L (ref 3.5–5.1)
SODIUM: 139 meq/L (ref 135–145)

## 2013-03-01 LAB — LIPID PANEL
CHOL/HDL RATIO: 2
CHOLESTEROL: 188 mg/dL (ref 0–200)
HDL: 101.5 mg/dL (ref >39.00)
LDL CALC: 57 mg/dL (ref 0–99)
Triglycerides: 146 mg/dL (ref 0.0–149.0)
VLDL: 29.2 mg/dL (ref 0.0–40.0)

## 2013-03-01 MED ORDER — ZOSTER VACCINE LIVE 19400 UNT/0.65ML ~~LOC~~ SOLR: 0.6500 mL | Freq: Once | SUBCUTANEOUS | Status: DC

## 2013-03-01 MED ORDER — PANTOPRAZOLE SODIUM 40 MG PO TBEC: 40.0000 mg | DELAYED_RELEASE_TABLET | Freq: Every day | ORAL | Status: DC

## 2013-03-01 NOTE — Patient Instructions (Signed)

## 2013-03-01 NOTE — Progress Notes (Signed)
Pre visit review using our clinic review tool, if applicable. No additional management support is needed unless otherwise documented below in the visit note. 

## 2013-03-01 NOTE — Progress Notes (Signed)
  Subjective:    Patient here for follow-up of elevated blood pressure.  She is not exercising and is adherent to a low-salt diet.  Blood pressure is well controlled at home. Cardiac symptoms: none. Patient denies: chest pain, chest pressure/discomfort, claudication, dyspnea, exertional chest pressure/discomfort, fatigue, irregular heart beat, lower extremity edema, near-syncope, orthopnea, palpitations, paroxysmal nocturnal dyspnea, syncope and tachypnea. Cardiovascular risk factors: advanced age (older than 85 for men, 64 for women), dyslipidemia, hypertension and sedentary lifestyle. Use of agents associated with hypertension: none. History of target organ damage: none. Pt is also here to get refills and c/o bladder trouble with bladder.  When she stands up urine just comes out.   The following portions of the patient's history were reviewed and updated as appropriate: allergies, current medications, past family history, past medical history, past social history, past surgical history and problem list.  Review of Systems Pertinent items are noted in HPI.    Objective:    BP 124/78  Pulse 60  Temp(Src) 97.9 F (36.6 C) (Oral)  Wt 129 lb (58.514 kg) General appearance: alert, cooperative, appears stated age and no distress Throat: lips, mucosa, and tongue normal; teeth and gums normal Neck: no adenopathy, no carotid bruit, no JVD, supple, symmetrical, trachea midline and thyroid not enlarged, symmetric, no tenderness/mass/nodules Lungs: clear to auscultation bilaterally Heart: S1, S2 normal Extremities: extremities normal, atraumatic, no cyanosis or edema    Assessment:    Hypertension, normal blood pressure . Evidence of target organ damage: none.    Plan:    Medication: no change. Dietary sodium restriction. Regular aerobic exercise. Follow up: 6 months and as needed.

## 2013-03-01 NOTE — Assessment & Plan Note (Signed)
Check urine.

## 2013-03-01 NOTE — Assessment & Plan Note (Signed)
Check labs con't meds 

## 2013-03-02 ENCOUNTER — Other Ambulatory Visit: Payer: Self-pay | Admitting: Family Medicine

## 2013-03-03 ENCOUNTER — Ambulatory Visit (INDEPENDENT_AMBULATORY_CARE_PROVIDER_SITE_OTHER): Payer: 59

## 2013-03-03 DIAGNOSIS — M810 Age-related osteoporosis without current pathological fracture: Secondary | ICD-10-CM

## 2013-03-03 MED ORDER — DENOSUMAB 60 MG/ML ~~LOC~~ SOLN
60.0000 mg | Freq: Once | SUBCUTANEOUS | Status: AC
  Administered 2013-03-03: 60 mg via SUBCUTANEOUS

## 2013-03-03 MED ORDER — BENZONATATE 100 MG PO CAPS: 100.0000 mg | ORAL_CAPSULE | ORAL | Status: AC | PRN

## 2013-03-05 ENCOUNTER — Encounter: Payer: Self-pay | Admitting: Family Medicine

## 2013-03-09 ENCOUNTER — Telehealth: Payer: Self-pay | Admitting: Family Medicine

## 2013-03-09 NOTE — Telephone Encounter (Signed)
Relevant patient education mailed to patient.  

## 2013-03-22 ENCOUNTER — Other Ambulatory Visit: Payer: Self-pay | Admitting: Family Medicine

## 2013-03-30 ENCOUNTER — Telehealth: Payer: Self-pay | Admitting: *Deleted

## 2013-03-30 NOTE — Telephone Encounter (Signed)
Called patient to schedule her prolia injection but no answer left a voice mail for her to call us back. cm

## 2013-03-30 NOTE — Telephone Encounter (Signed)
Prolia injection complete on 03/03/13.      KP

## 2013-04-07 ENCOUNTER — Other Ambulatory Visit: Payer: Self-pay | Admitting: Family Medicine

## 2013-04-21 ENCOUNTER — Telehealth: Payer: Self-pay | Admitting: *Deleted

## 2013-04-21 NOTE — Telephone Encounter (Signed)
Called and left message for patient to please call and schedule a nurse visit to receive Prolia injection. Received approval from insurance company that Prolia injection will have a co-pay of $40 that patient is responsible for. Prolia injection will be ordered once appointment is made. JG//CMA

## 2013-04-27 IMAGING — CR DG CHEST 2V
2 series · 2 of 2 positions shown · non-contrast
Comparison: 05/07/2006

CLINICAL DATA: Preop left knee osteoarthritis

CHEST - 2 VIEW

[w chest pa]
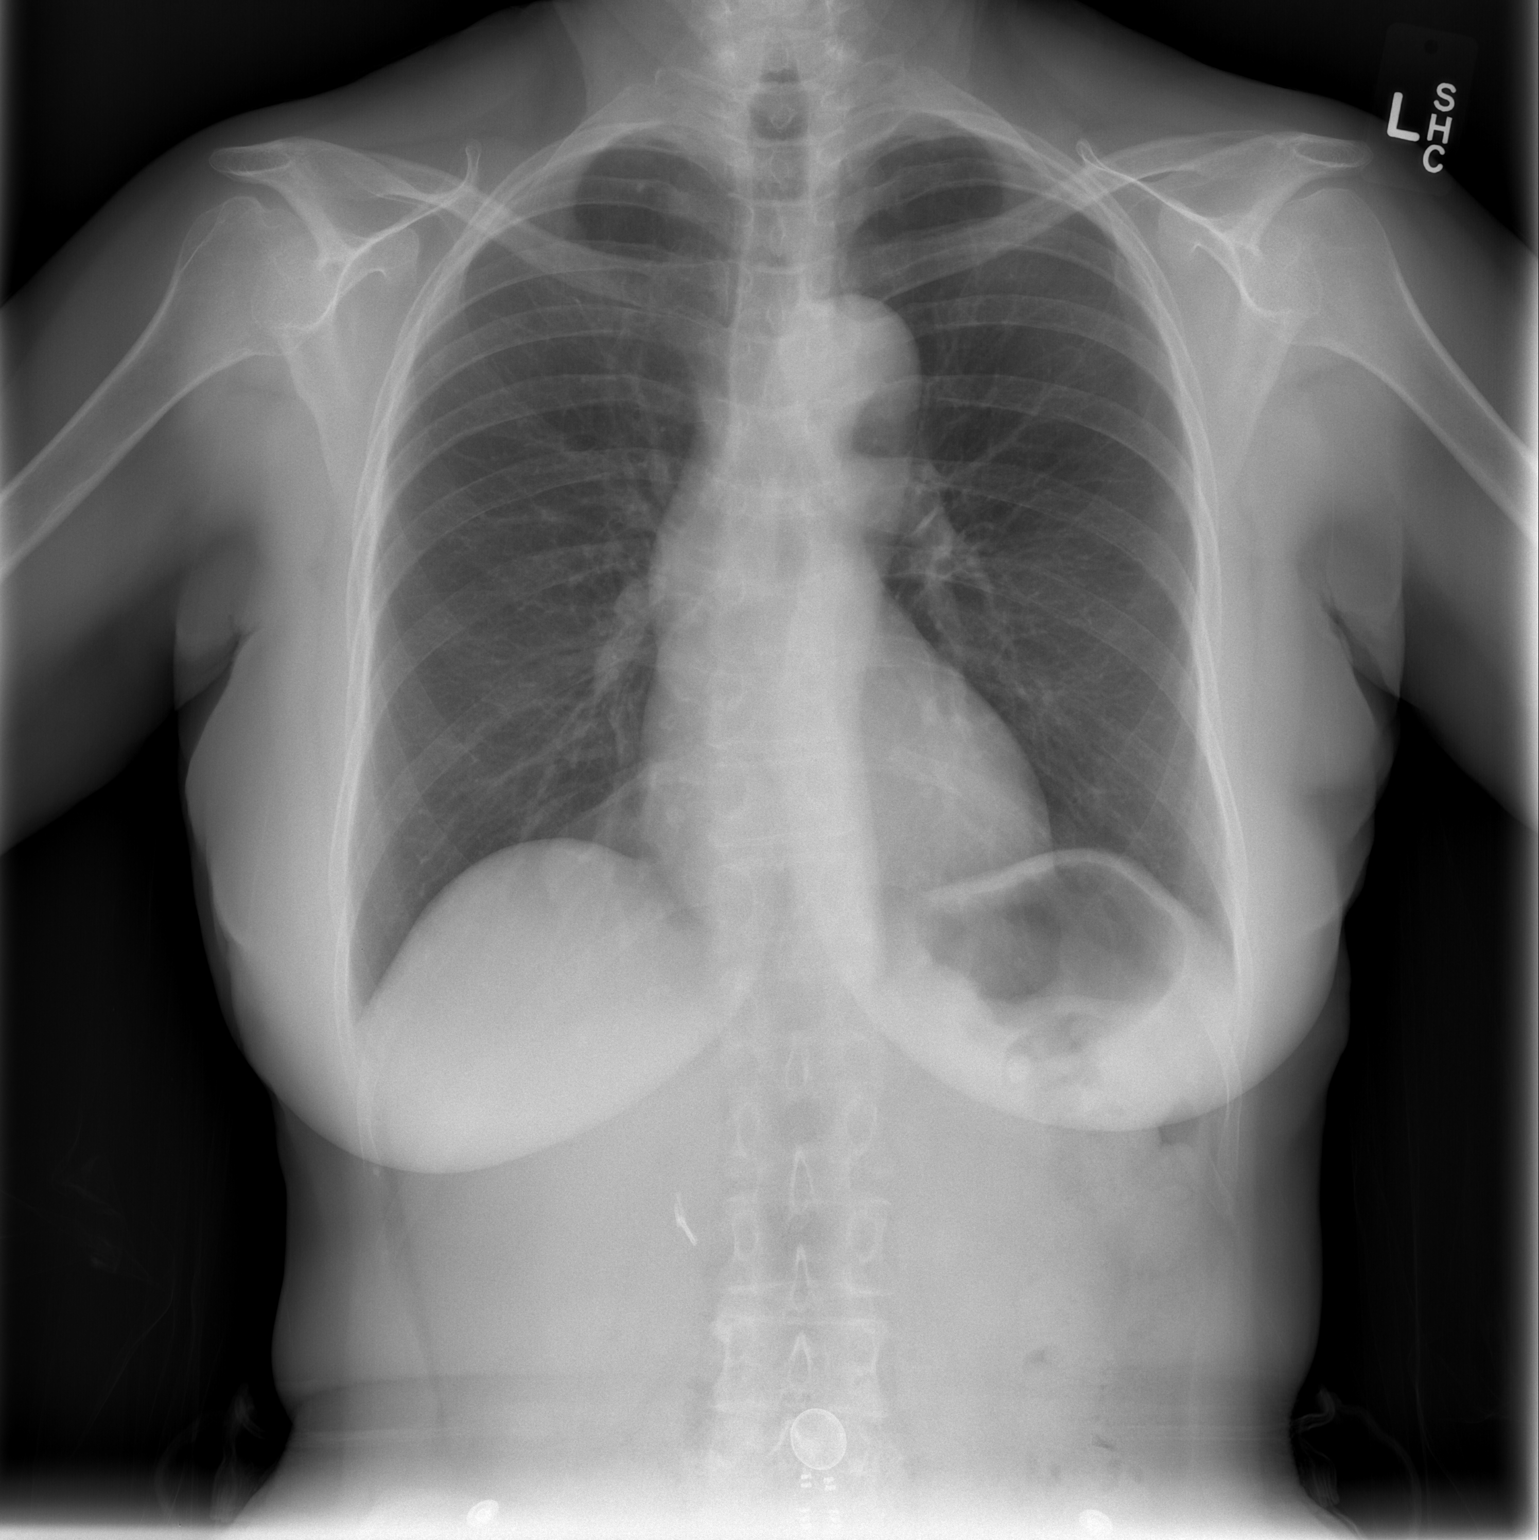

[w chest lat]
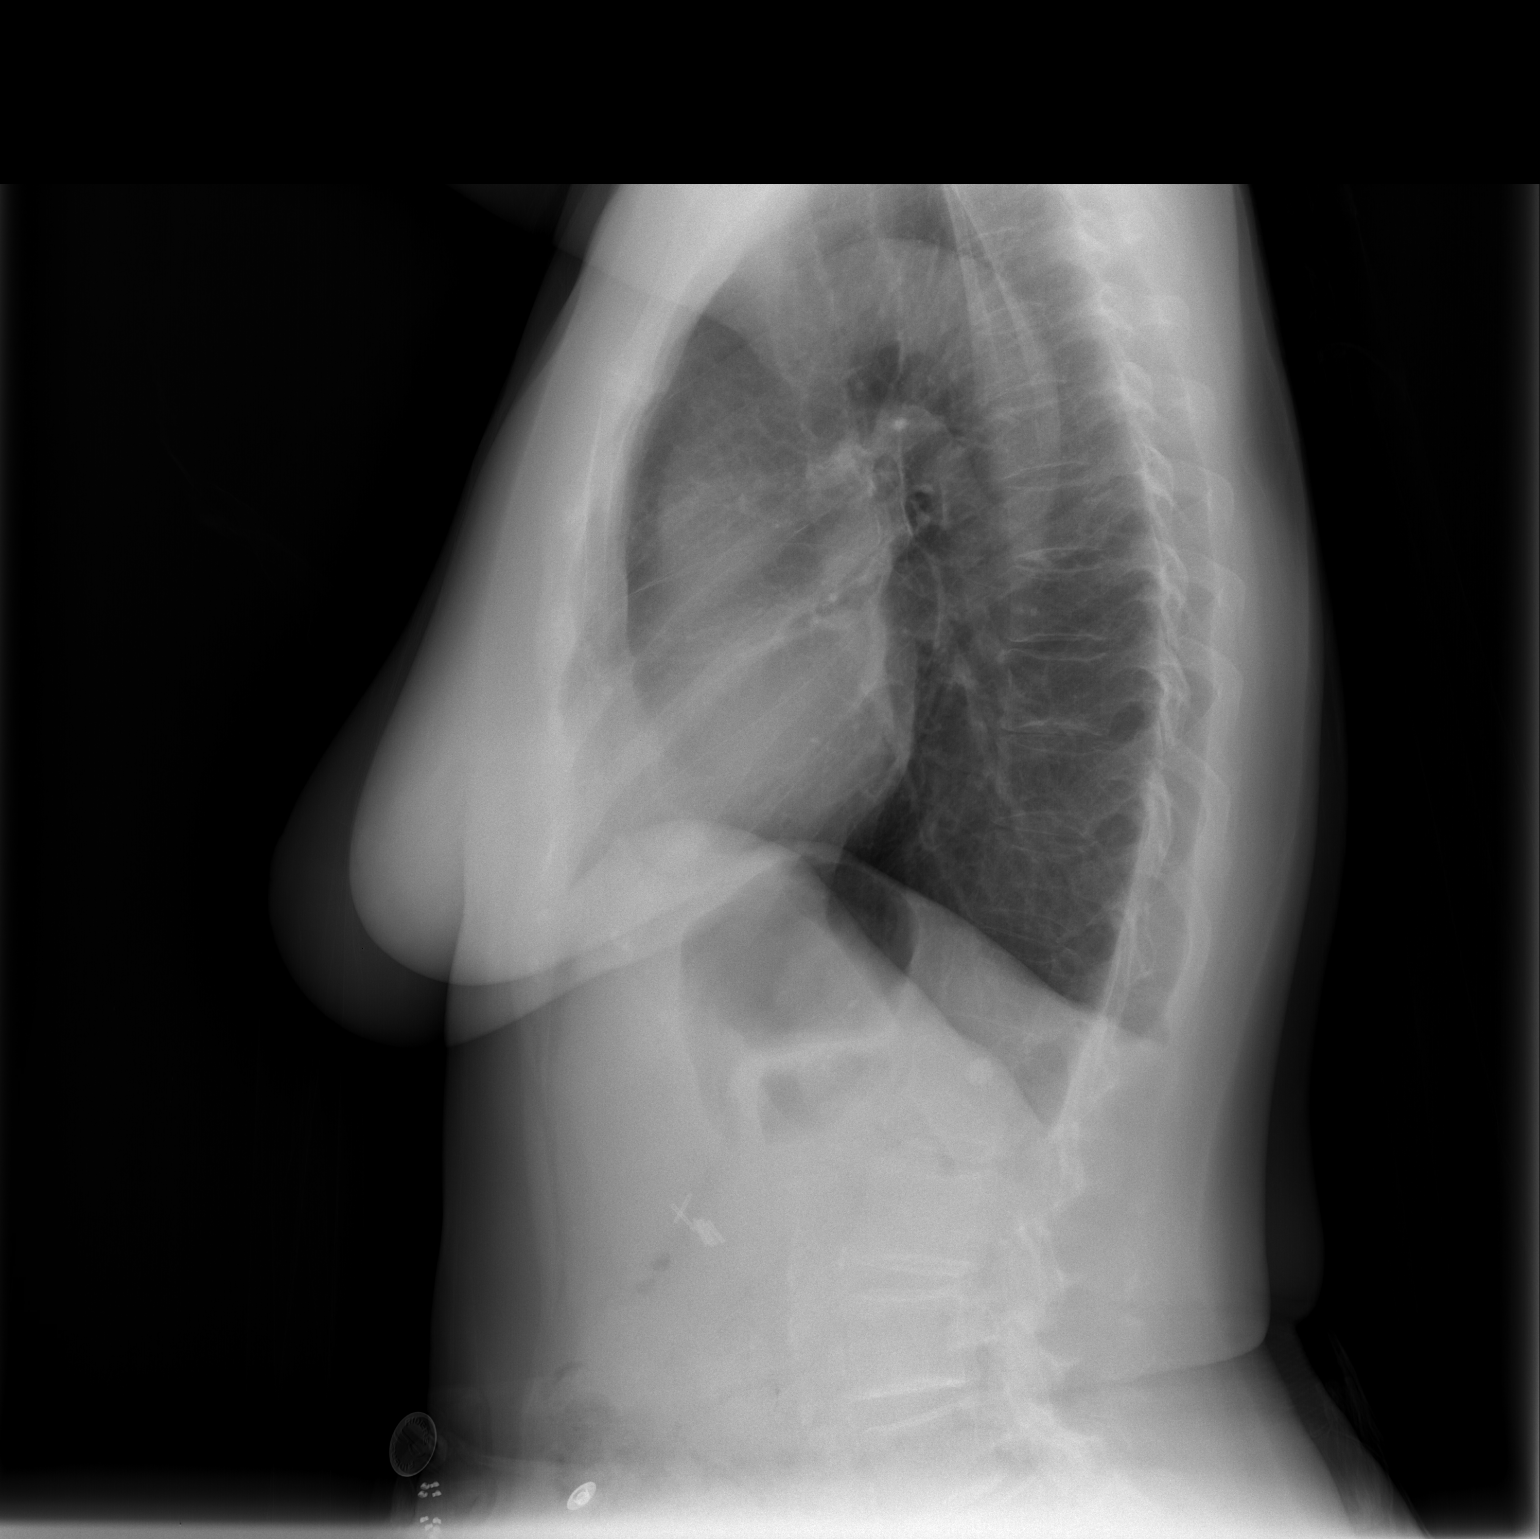

[2 of 2 positions shown; findings below may reference images not displayed]

FINDINGS: Heart size is normal.

No pleural effusion or pulmonary edema identified.

No airspace consolidation identified.

Review of the visualized osseous structures is unremarkable.
IMPRESSION: 1.  No active cardiopulmonary abnormalities.

## 2013-04-27 NOTE — Telephone Encounter (Signed)
This patient is not due for Prolia until July 2015. Please forward Prolia benefits verification to me. Thanks.     KP

## 2013-08-17 ENCOUNTER — Telehealth: Payer: Self-pay | Admitting: Family Medicine

## 2013-08-17 NOTE — Telephone Encounter (Signed)
Lmovm for patient to call and schedule her Prolia injection.

## 2013-08-18 ENCOUNTER — Telehealth: Payer: Self-pay

## 2013-08-18 NOTE — Telephone Encounter (Signed)
Benefits verified. Admin and Prolia are subject to $50 co-pay up to a $4000 out of pocket max (90 met). If an office visit is billed , $20 co-pay will apply. Once met, coverage increases to 100% of the contracted rate, No deductible applies. No PA needed. Prolia due on or after 08/31/13.    KP

## 2013-08-22 ENCOUNTER — Ambulatory Visit: Payer: 59 | Admitting: *Deleted

## 2013-09-01 ENCOUNTER — Ambulatory Visit (INDEPENDENT_AMBULATORY_CARE_PROVIDER_SITE_OTHER): Payer: 59 | Admitting: *Deleted

## 2013-09-01 DIAGNOSIS — M810 Age-related osteoporosis without current pathological fracture: Secondary | ICD-10-CM

## 2013-09-01 MED ORDER — DENOSUMAB 60 MG/ML ~~LOC~~ SOLN
60.0000 mg | Freq: Once | SUBCUTANEOUS | Status: AC
  Administered 2013-09-01: 60 mg via SUBCUTANEOUS

## 2013-09-07 ENCOUNTER — Other Ambulatory Visit: Payer: Self-pay | Admitting: Family Medicine

## 2013-09-21 ENCOUNTER — Other Ambulatory Visit: Payer: Self-pay | Admitting: Family Medicine

## 2013-10-03 ENCOUNTER — Other Ambulatory Visit: Payer: Self-pay | Admitting: Family Medicine

## 2013-10-04 ENCOUNTER — Encounter: Payer: Self-pay | Admitting: Family Medicine

## 2013-10-04 ENCOUNTER — Ambulatory Visit (INDEPENDENT_AMBULATORY_CARE_PROVIDER_SITE_OTHER): Payer: 59 | Admitting: Family Medicine

## 2013-10-04 VITALS — BP 132/70 | HR 66 | Temp 98.5°F | Wt 128.1 lb

## 2013-10-04 DIAGNOSIS — E785 Hyperlipidemia, unspecified: Secondary | ICD-10-CM

## 2013-10-04 DIAGNOSIS — I10 Essential (primary) hypertension: Secondary | ICD-10-CM

## 2013-10-04 DIAGNOSIS — Z23 Encounter for immunization: Secondary | ICD-10-CM

## 2013-10-04 DIAGNOSIS — K219 Gastro-esophageal reflux disease without esophagitis: Secondary | ICD-10-CM

## 2013-10-04 MED ORDER — METOPROLOL TARTRATE 25 MG PO TABS: ORAL_TABLET | ORAL | Status: AC

## 2013-10-04 MED ORDER — PANTOPRAZOLE SODIUM 40 MG PO TBEC: DELAYED_RELEASE_TABLET | ORAL | Status: AC

## 2013-10-04 MED ORDER — ZOSTER VACCINE LIVE 19400 UNT/0.65ML ~~LOC~~ SOLR: 0.6500 mL | Freq: Once | SUBCUTANEOUS | Status: AC

## 2013-10-04 MED ORDER — ATORVASTATIN CALCIUM 40 MG PO TABS: 40.0000 mg | ORAL_TABLET | Freq: Every day | ORAL | Status: DC

## 2013-10-04 NOTE — Progress Notes (Signed)
Pre visit review using our clinic review tool, if applicable. No additional management support is needed unless otherwise documented below in the visit note. 

## 2013-10-04 NOTE — Patient Instructions (Signed)

## 2013-10-04 NOTE — Progress Notes (Signed)
  Subjective:    Patient here for follow-up of elevated blood pressure.  She is not exercising and is adherent to a low-salt diet.  Blood pressure is well controlled at home. Cardiac symptoms: none. Patient denies: chest pain, chest pressure/discomfort, claudication, dyspnea, exertional chest pressure/discomfort, fatigue, irregular heart beat, lower extremity edema, near-syncope, orthopnea, palpitations, paroxysmal nocturnal dyspnea, syncope and tachypnea. Cardiovascular risk factors: advanced age (older than 17 for men, 21 for women), dyslipidemia and hypertension. Use of agents associated with hypertension: none. History of target organ damage: none.  The following portions of the patient's history were reviewed and updated as appropriate: allergies, current medications, past family history, past medical history, past social history, past surgical history and problem list.  Review of Systems Pertinent items are noted in HPI.     Objective:    BP 132/70  Pulse 66  Temp(Src) 98.5 F (36.9 C) (Oral)  Wt 128 lb 1.4 oz (58.1 kg)  SpO2 98% General appearance: alert, cooperative, appears stated age and no distress Neck: no adenopathy, supple, symmetrical, trachea midline and thyroid not enlarged, symmetric, no tenderness/mass/nodules Lungs: clear to auscultation bilaterally Heart: S1, S2 normal Extremities: extremities normal, atraumatic, no cyanosis or edema    Assessment:    Hypertension, normal blood pressure . Evidence of target organ damage: none.    Plan:    Medication: no change. Dietary sodium restriction. Regular aerobic exercise. Follow up: 6 months and as needed.   1. Other and unspecified hyperlipidemia Check labs - Basic metabolic panel - Hepatic function panel - Lipid panel - atorvastatin (LIPITOR) 40 MG tablet; Take 1 tablet (40 mg total) by mouth at bedtime. Labs are due now  Dispense: 30 tablet; Refill: 0  2. Essential hypertension stable - Basic metabolic  panel - Hepatic function panel - Lipid panel - metoprolol tartrate (LOPRESSOR) 25 MG tablet; TAKE 1/2 TABLET (12.5 MG TOTAL) BY MOUTH 2 (TWO) TIMES DAILY.  Dispense: 90 tablet; Refill: 3  3. Gastroesophageal reflux disease without esophagitis   - pantoprazole (PROTONIX) 40 MG tablet; TAKE 1 TABLET BY MOUTH TWICE DAILY.  Dispense: 180 tablet; Refill: 3

## 2013-10-05 LAB — HEPATIC FUNCTION PANEL
ALT: 36 U/L — ABNORMAL HIGH (ref 0–35)
AST: 28 U/L (ref 0–37)
Albumin: 4.2 g/dL (ref 3.5–5.2)
Alkaline Phosphatase: 69 U/L (ref 39–117)
BILIRUBIN TOTAL: 0.7 mg/dL (ref 0.2–1.2)
Bilirubin, Direct: 0.1 mg/dL (ref 0.0–0.3)
Total Protein: 7.2 g/dL (ref 6.0–8.3)

## 2013-10-05 LAB — BASIC METABOLIC PANEL
BUN: 16 mg/dL (ref 6–23)
CO2: 30 mEq/L (ref 19–32)
CREATININE: 0.8 mg/dL (ref 0.4–1.2)
Calcium: 9 mg/dL (ref 8.4–10.5)
Chloride: 104 mEq/L (ref 96–112)
GFR: 73 mL/min (ref >60.00)
Glucose, Bld: 127 mg/dL — ABNORMAL HIGH (ref 70–99)
POTASSIUM: 4.7 meq/L (ref 3.5–5.1)
Sodium: 142 mEq/L (ref 135–145)

## 2013-10-05 LAB — LIPID PANEL
CHOLESTEROL: 178 mg/dL (ref 0–200)
HDL: 79.3 mg/dL (ref >39.00)
LDL Cholesterol: 68 mg/dL (ref 0–99)
NonHDL: 98.7
TRIGLYCERIDES: 152 mg/dL — AB (ref 0.0–149.0)
Total CHOL/HDL Ratio: 2
VLDL: 30.4 mg/dL (ref 0.0–40.0)

## 2013-10-25 ENCOUNTER — Telehealth: Payer: Self-pay

## 2013-10-25 NOTE — Telephone Encounter (Signed)
LVM for pt to call back and schedule AWV (annual wellness visit).   *to be scheduled with Eula Listen, PA if at all possible.

## 2013-11-24 ENCOUNTER — Other Ambulatory Visit: Payer: Self-pay | Admitting: Family Medicine

## 2014-03-01 ENCOUNTER — Telehealth: Payer: Self-pay

## 2014-03-01 NOTE — Telephone Encounter (Signed)
Prolia benefits verified Prolia is subject to a $50 co-pay up to a $4000 out of pocket max ($64 met). Whether office visit (OV) is billed or not, the patient responsible for a $20 co-pay which will cover admin and the OV. No deductible applies. MSG left to schedule Prolia injection.      KP

## 2014-04-06 ENCOUNTER — Ambulatory Visit: Payer: 59 | Admitting: Family Medicine

## 2014-04-10 ENCOUNTER — Telehealth: Payer: Self-pay | Admitting: Family Medicine

## 2014-04-10 NOTE — Telephone Encounter (Signed)
Message left advising she ca have water, black coffee and if her apt is in the late afternoon she can have a small breakfast.       KP

## 2014-04-10 NOTE — Telephone Encounter (Signed)
Pt returned call and wants to have the called returned. Please advise.

## 2014-04-10 NOTE — Telephone Encounter (Signed)
Msg left to call the office     KP 

## 2014-04-10 NOTE — Telephone Encounter (Signed)
Caller name:Gustavia Relationship to patient:self  Reason for call: Pt has 6 month follow up scheduled on 3/15- pt wanting to know if blood work will be done and if so should she fast?

## 2014-04-10 NOTE — Telephone Encounter (Signed)
Patient will not fast and she will be seen on the 15th and recheck labs at another time.      KP

## 2014-04-13 ENCOUNTER — Ambulatory Visit: Payer: Self-pay | Admitting: Family Medicine

## 2014-04-18 ENCOUNTER — Encounter: Payer: Self-pay | Admitting: Family Medicine

## 2014-04-18 ENCOUNTER — Ambulatory Visit (INDEPENDENT_AMBULATORY_CARE_PROVIDER_SITE_OTHER): Payer: Medicare Other | Admitting: Family Medicine

## 2014-04-18 VITALS — BP 140/88 | HR 77 | Temp 98.3°F | Wt 124.6 lb

## 2014-04-18 DIAGNOSIS — M810 Age-related osteoporosis without current pathological fracture: Secondary | ICD-10-CM

## 2014-04-18 DIAGNOSIS — Z23 Encounter for immunization: Secondary | ICD-10-CM

## 2014-04-18 DIAGNOSIS — Z1239 Encounter for other screening for malignant neoplasm of breast: Secondary | ICD-10-CM

## 2014-04-18 DIAGNOSIS — I10 Essential (primary) hypertension: Secondary | ICD-10-CM

## 2014-04-18 DIAGNOSIS — E785 Hyperlipidemia, unspecified: Secondary | ICD-10-CM

## 2014-04-18 LAB — POCT URINALYSIS DIPSTICK
BILIRUBIN UA: NEGATIVE
Blood, UA: NEGATIVE
KETONES UA: NEGATIVE
LEUKOCYTES UA: NEGATIVE
Nitrite, UA: NEGATIVE
Protein, UA: NEGATIVE
Spec Grav, UA: 1.02
Urobilinogen, UA: 4
pH, UA: 6

## 2014-04-18 MED ORDER — DENOSUMAB 60 MG/ML ~~LOC~~ SOLN
60.0000 mg | Freq: Once | SUBCUTANEOUS | Status: AC
  Administered 2014-04-18: 60 mg via SUBCUTANEOUS

## 2014-04-18 NOTE — Progress Notes (Signed)
Patient ID: [name], female    DOB: 1935/07/13  Age: 79 y.o. MRN: [id number]    Subjective:  Subjective HPI [name] presents for f/u htn, hyperlipidemia, and osteoporosis.  Review of Systems  Constitutional: Negative for activity change, appetite change, fatigue and unexpected weight change.  Respiratory: Negative for cough and shortness of breath.   Cardiovascular: Negative for chest pain and palpitations.  Psychiatric/Behavioral: Negative for behavioral problems and dysphoric mood. The patient is not nervous/anxious.     History Past Medical History  Diagnosis Date  . Hyperlipidemia   . Seizures   . Cerebrovascular accident 2000  . Osteoporosis   . Hypertension   . Colon polyps 2001    Tubular Adenoma  . Diverticulosis 2006  . Arthritis   . Gallstones     She has past surgical history that includes Cholecystectomy; Breast lumpectomy (1975); and Tonsillectomy.   Her family history includes Diabetes in her father; Heart attack (age of onset: 42) in her father; Heart disease in her brother; Osteoporosis in an other family member.She reports that she has never smoked. She has never used smokeless tobacco. She reports that she does not drink alcohol or use illicit drugs.  Current Outpatient Prescriptions on File Prior to Visit  Medication Sig Dispense Refill  . acetaminophen (TYLENOL) 500 MG tablet Take 500 mg by mouth every 6 (six) hours as needed.      Marland Kitchen ADVANCED FIBER COMPLEX PO Take 1 capsule by mouth daily.    Marland Kitchen aspirin 81 MG tablet Take 81 mg by mouth daily.      Marland Kitchen atorvastatin (LIPITOR) 40 MG tablet Take 1 tablet (40 mg total) by mouth daily. 30 tablet 5  . benzonatate (TESSALON) 100 MG capsule Take 1-2 capsules (100-200 mg total) by mouth as needed for cough. 30 capsule 0  . Calcium Carbonate-Vitamin D (CALCIUM + D) 600-200 MG-UNIT TABS Take 1 tablet by mouth daily.      . carbamazepine (CARBATROL) 300 MG 12 hr capsule Take 1 capsule (300 mg total) by  mouth 2 (two) times daily. 60 capsule 1  . clopidogrel (PLAVIX) 75 MG tablet Take 1 tablet (75 mg total) by mouth daily. 30 tablet 1  . diphenhydrAMINE (BENADRYL) 25 MG tablet Take 25 mg by mouth every 6 (six) hours as needed.     . metoprolol tartrate (LOPRESSOR) 25 MG tablet TAKE 1/2 TABLET (12.5 MG TOTAL) BY MOUTH 2 (TWO) TIMES DAILY. 90 tablet 3  . Multiple Vitamin (ONE-A-DAY 55 PLUS PO) Take 1 tablet by mouth daily.      . pantoprazole (PROTONIX) 40 MG tablet TAKE 1 TABLET BY MOUTH TWICE DAILY. 180 tablet 3  . polyethylene glycol (MIRALAX / GLYCOLAX) packet Take 17 g by mouth daily as needed.     . zoster vaccine live, PF, (ZOSTAVAX) 40981 UNT/0.65ML injection Inject 19,400 Units into the skin once. 1 vial 0   No current facility-administered medications on file prior to visit.     Objective:  Objective Physical Exam  Constitutional: She is oriented to person, place, and time. She appears well-developed and well-nourished. No distress.  HENT:  Right Ear: External ear normal.  Left Ear: External ear normal.  Nose: Nose normal.  Mouth/Throat: Oropharynx is clear and moist.  Eyes: EOM are normal. Pupils are equal, round, and reactive to light.  Neck: Normal range of motion. Neck supple.  Cardiovascular: Normal rate, regular rhythm and normal heart sounds.   No murmur heard. Pulmonary/Chest: Effort normal and breath  sounds normal. No respiratory distress. She has no wheezes. She has no rales. She exhibits no tenderness.  Neurological: She is alert and oriented to person, place, and time.  Psychiatric: She has a normal mood and affect. Her behavior is normal. Judgment and thought content normal.   BP 140/88 mmHg  Pulse 77  Temp(Src) 98.3 F (36.8 C) (Oral)  Wt 124 lb 9.6 oz (56.518 kg)  SpO2 95% Wt Readings from Last 3 Encounters:  04/18/14 124 lb 9.6 oz (56.518 kg)  10/04/13 128 lb 1.4 oz (58.1 kg)  03/01/13 129 lb (58.514 kg)     Lab Results  Component Value Date   WBC  7.7 06/22/2012   HGB 14.6 06/22/2012   HCT 42.9 06/22/2012   PLT 319.0 06/22/2012   GLUCOSE 127* 10/04/2013   CHOL 178 10/04/2013   TRIG 152.0* 10/04/2013   HDL 79.30 10/04/2013   LDLDIRECT 69.6 10/19/2009   LDLCALC 68 10/04/2013   ALT 36* 10/04/2013   AST 28 10/04/2013   NA 142 10/04/2013   K 4.7 10/04/2013   CL 104 10/04/2013   CREATININE 0.8 10/04/2013   BUN 16 10/04/2013   CO2 30 10/04/2013   TSH 2.08 10/14/2011   INR 0.89 09/10/2010   HGBA1C 6.3 06/22/2012   MICROALBUR 3.8* 06/22/2012    Mm Digital Screening  09/07/2012   *RADIOLOGY REPORT*  Clinical Data: Screening.  DIGITAL SCREENING BILATERAL MAMMOGRAM WITH CAD  Comparison:  Previous exam(s).  FINDINGS:  ACR Breast Density Category b:  There are scattered areas of fibroglandular density.  There are no findings suspicious for malignancy.  Images were processed with CAD.  IMPRESSION: No mammographic evidence of malignancy.  A result letter of this screening mammogram will be mailed directly to the patient.  RECOMMENDATION: Screening mammogram in one year. (Code:SM-B-01Y)  BI-RADS CATEGORY 1:  Negative.   Original Report Authenticated By: Curlene Dolphin, M.D.     Assessment & Plan:  Plan I have discontinued [name]'s denosumab. I am also having her maintain her aspirin, clopidogrel, carbamazepine, Multiple Vitamin (ONE-A-DAY 55 PLUS PO), Calcium Carbonate-Vitamin D, polyethylene glycol, acetaminophen, diphenhydrAMINE, ADVANCED FIBER COMPLEX PO, benzonatate, zoster vaccine live (PF), metoprolol tartrate, pantoprazole, and atorvastatin. We administered denosumab.  Meds ordered this encounter  Medications  . denosumab (PROLIA) injection 60 mg    Sig:     Problem List Items Addressed This Visit      Unprioritized   Hyperlipidemia    con't lipitor Check labs      Relevant Orders   Hepatic function panel   Lipid panel   Microalbumin / creatinine urine ratio   POCT urinalysis dipstick (Completed)   HTN (hypertension)     Metoprolol       Relevant Orders   Basic metabolic panel   Microalbumin / creatinine urine ratio   POCT urinalysis dipstick (Completed)    Other Visit Diagnoses    Osteoporosis    -  Primary    Relevant Medications    denosumab (PROLIA) injection 60 mg (Completed)    Other Relevant Orders    DG Bone Density    Breast cancer screening        Relevant Orders    MM DIGITAL SCREENING BILATERAL    Encounter for immunization           Follow-up: Return in about 6 months (around 10/19/2014), or if symptoms worsen or fail to improve.  Garnet Koyanagi, DO

## 2014-04-18 NOTE — Assessment & Plan Note (Signed)
con't lipitor Check labs 

## 2014-04-18 NOTE — Progress Notes (Signed)
Pre visit review using our clinic review tool, if applicable. No additional management support is needed unless otherwise documented below in the visit note. 

## 2014-04-18 NOTE — Assessment & Plan Note (Signed)
Metoprolol 

## 2014-04-19 LAB — LIPID PANEL
CHOL/HDL RATIO: 2
CHOLESTEROL: 202 mg/dL — AB (ref 0–200)
HDL: 96 mg/dL (ref >39.00)
LDL Cholesterol: 76 mg/dL (ref 0–99)
NonHDL: 106
TRIGLYCERIDES: 149 mg/dL (ref 0.0–149.0)
VLDL: 29.8 mg/dL (ref 0.0–40.0)

## 2014-04-19 LAB — HEPATIC FUNCTION PANEL
ALT: 21 U/L (ref 0–35)
AST: 21 U/L (ref 0–37)
Albumin: 4.4 g/dL (ref 3.5–5.2)
Alkaline Phosphatase: 89 U/L (ref 39–117)
BILIRUBIN DIRECT: 0.1 mg/dL (ref 0.0–0.3)
Total Bilirubin: 0.4 mg/dL (ref 0.2–1.2)
Total Protein: 7.1 g/dL (ref 6.0–8.3)

## 2014-04-19 LAB — BASIC METABOLIC PANEL
BUN: 21 mg/dL (ref 6–23)
CALCIUM: 10.8 mg/dL — AB (ref 8.4–10.5)
CO2: 30 mEq/L (ref 19–32)
Chloride: 102 mEq/L (ref 96–112)
Creatinine, Ser: 0.93 mg/dL (ref 0.40–1.20)
GFR: 62.15 mL/min (ref >60.00)
GLUCOSE: 101 mg/dL — AB (ref 70–99)
POTASSIUM: 4.8 meq/L (ref 3.5–5.1)
Sodium: 140 mEq/L (ref 135–145)

## 2014-04-19 LAB — MICROALBUMIN / CREATININE URINE RATIO
CREATININE, U: 162.1 mg/dL
Microalb Creat Ratio: 1.2 mg/g (ref 0.0–30.0)
Microalb, Ur: 2 mg/dL — ABNORMAL HIGH (ref 0.0–1.9)

## 2014-04-23 ENCOUNTER — Other Ambulatory Visit: Payer: Self-pay | Admitting: Family Medicine

## 2014-04-27 ENCOUNTER — Telehealth: Payer: Self-pay | Admitting: Family Medicine

## 2014-04-27 NOTE — Telephone Encounter (Signed)
Caller name: Leigha Relation to pt: self  Call back number:[phone] or [phone] Pharmacy:  Reason for call: Pt was returning call. Please advise

## 2014-05-01 NOTE — Telephone Encounter (Signed)
See lab results.      KP

## 2014-05-02 ENCOUNTER — Other Ambulatory Visit (INDEPENDENT_AMBULATORY_CARE_PROVIDER_SITE_OTHER): Payer: Medicare Other

## 2014-05-03 ENCOUNTER — Telehealth: Payer: Self-pay | Admitting: Family Medicine

## 2014-05-03 DIAGNOSIS — E213 Hyperparathyroidism, unspecified: Secondary | ICD-10-CM

## 2014-05-03 LAB — PTH, INTACT AND CALCIUM
Calcium: 9.8 mg/dL (ref 8.4–10.5)
PTH: 75 pg/mL — AB (ref 14–64)

## 2014-05-03 NOTE — Telephone Encounter (Signed)
Caller name:Gest, Izora Gala Relation to VO:PFYT Call back number: [phone] Pharmacy:  Reason for call: pt returning your call regarding her lab results please call back

## 2014-05-04 NOTE — Telephone Encounter (Signed)
Discussed with patient and she verbalized understanding, Ref has been placed.      KP

## 2014-05-04 NOTE — Telephone Encounter (Signed)
Notes Recorded by Rosalita Chessman, DO on 05/03/2014 at 1:01 PM Refer to endo for hyperparathyroidism

## 2014-05-05 ENCOUNTER — Telehealth: Payer: Self-pay | Admitting: Family Medicine

## 2014-05-05 NOTE — Telephone Encounter (Signed)
Please advise      KP 

## 2014-05-05 NOTE — Telephone Encounter (Signed)
She can if she likes

## 2014-05-05 NOTE — Telephone Encounter (Signed)
Caller name: Delany Relation to pt: self Call back number: [phone] Pharmacy:  Reason for call:   Patient is wanting to know if she should continue taking her multi vitamins

## 2014-05-08 NOTE — Telephone Encounter (Signed)
Patient has been made aware that she can start the multivitamin.      KP

## 2014-05-25 ENCOUNTER — Other Ambulatory Visit: Payer: Self-pay | Admitting: Family Medicine

## 2014-05-25 ENCOUNTER — Encounter: Payer: Self-pay | Admitting: Endocrinology

## 2014-06-28 ENCOUNTER — Ambulatory Visit: Payer: Medicare Other | Admitting: Internal Medicine

## 2014-06-28 ENCOUNTER — Telehealth: Payer: Self-pay | Admitting: *Deleted

## 2014-06-28 NOTE — Telephone Encounter (Signed)
EMS calling to state that patient passed and would like Dr. Etter Sjogren to sign death certificate when she is in office (notified EMS that that would be 06/29/14 after noon).

## 2014-06-28 NOTE — Telephone Encounter (Signed)
[name] from [employer] P: [phone] called stating that they cannot proceed with cremation until next week anyway. Would like to know if Dr. Etter Sjogren would sign death certificate?

## 2014-06-29 NOTE — Telephone Encounter (Signed)
[name] has been made aware.     KP

## 2014-06-29 NOTE — Telephone Encounter (Signed)
yes

## 2014-07-04 ENCOUNTER — Telehealth: Payer: Self-pay | Admitting: Family Medicine

## 2014-07-04 NOTE — Telephone Encounter (Signed)
The death certificate has been signed and was done after the call, I left the message last week. I will not call at this time because they are in service for the patient.     KP

## 2014-07-04 NOTE — Telephone Encounter (Signed)
Caller name: Samella Parr Relationship to patient: daughter Can be reached: [phone]  Reason for call: Services are today at 12:30. She states that she had a voicemail that Dr. Etter Sjogren had questions about death certificate. She states that she doesn't want to hold up the services/process. Please call her asap.

## 2014-07-05 DEATH — deceased

## 2014-10-19 ENCOUNTER — Ambulatory Visit: Payer: Medicare Other | Admitting: Family Medicine
